# Patient Record
Sex: Female | Born: 1935 | Race: White
Health system: Southern US, Community
[De-identification: ages and names within clinical notes are randomized; demographics above are authoritative.]

## PROBLEM LIST (undated history)

## (undated) DIAGNOSIS — E079 Disorder of thyroid, unspecified: Secondary | ICD-10-CM

## (undated) DIAGNOSIS — M79602 Pain in left arm: Secondary | ICD-10-CM

## (undated) DIAGNOSIS — R42 Dizziness and giddiness: Secondary | ICD-10-CM

## (undated) DIAGNOSIS — E039 Hypothyroidism, unspecified: Secondary | ICD-10-CM

## (undated) DIAGNOSIS — Z950 Presence of cardiac pacemaker: Secondary | ICD-10-CM

## (undated) DIAGNOSIS — I499 Cardiac arrhythmia, unspecified: Secondary | ICD-10-CM

## (undated) DIAGNOSIS — K5900 Constipation, unspecified: Secondary | ICD-10-CM

## (undated) DIAGNOSIS — I472 Ventricular tachycardia, unspecified: Secondary | ICD-10-CM

## (undated) DIAGNOSIS — K219 Gastro-esophageal reflux disease without esophagitis: Secondary | ICD-10-CM

## (undated) DIAGNOSIS — N814 Uterovaginal prolapse, unspecified: Secondary | ICD-10-CM

## (undated) DIAGNOSIS — I4891 Unspecified atrial fibrillation: Secondary | ICD-10-CM

## (undated) DIAGNOSIS — I4719 Other supraventricular tachycardia: Secondary | ICD-10-CM

## (undated) DIAGNOSIS — E785 Hyperlipidemia, unspecified: Secondary | ICD-10-CM

## (undated) DIAGNOSIS — R079 Chest pain, unspecified: Secondary | ICD-10-CM

## (undated) DIAGNOSIS — R0602 Shortness of breath: Secondary | ICD-10-CM

## (undated) DIAGNOSIS — I471 Supraventricular tachycardia: Secondary | ICD-10-CM

## (undated) DIAGNOSIS — H9319 Tinnitus, unspecified ear: Secondary | ICD-10-CM

## (undated) HISTORY — DX: Unspecified atrial fibrillation: I48.91

## (undated) HISTORY — PX: ADENOIDECTOMY: SUR15

## (undated) HISTORY — DX: Supraventricular tachycardia: I47.1

## (undated) HISTORY — PX: TONSILLECTOMY: SUR1361

## (undated) HISTORY — DX: Other supraventricular tachycardia: I47.19

## (undated) HISTORY — DX: Shortness of breath: R06.02

## (undated) HISTORY — DX: Uterovaginal prolapse, unspecified: N81.4

## (undated) HISTORY — DX: Hyperlipidemia, unspecified: E78.5

## (undated) HISTORY — DX: Tinnitus, unspecified ear: H93.19

## (undated) HISTORY — DX: Pain in left arm: M79.602

## (undated) HISTORY — PX: MOHS SURGERY: SUR867

## (undated) HISTORY — DX: Dizziness and giddiness: R42

## (undated) HISTORY — DX: Chest pain, unspecified: R07.9

## (undated) HISTORY — DX: Ventricular tachycardia: I47.2

## (undated) HISTORY — DX: Hypothyroidism, unspecified: E03.9

## (undated) HISTORY — DX: Ventricular tachycardia, unspecified: I47.20

## (undated) HISTORY — PX: DENTAL SURGERY: SHX609

## (undated) SURGERY — PM GENERATOR CHANGE DUAL/BI-V
Anesthesia: Choice | Laterality: Bilateral

---

## 1995-11-16 ENCOUNTER — Ambulatory Visit
Admit: 1995-11-16 | Disposition: A | Discharge status: Home / Self Care | Payer: Self-pay | Source: Ambulatory Visit | Admitting: Plastic Surgery within the Head & Neck

## 2010-06-24 ENCOUNTER — Ambulatory Visit
Admission: RE | Admit: 2010-06-24 | Disposition: A | Discharge status: Home / Self Care | Payer: Self-pay | Source: Ambulatory Visit | Admitting: Cardiovascular Disease

## 2011-06-16 NOTE — Procedures (Signed)
[facility]      Name:         Margaret Shea, Margaret Shea                   Age          77      Study Date    06/24/2010                     Gender       Female      MRN           [id number]                        Race         Caucasian      EP Study No   [id number]                        Ht(in)       65.0      Account No.   [id number]                       Wt(lbs)      160.1      Date of Birth [birth year]                     BSA          1.80            CASE INFORMATION      Surgeon:                     Sandesara,Chirag,M.D.      Device Access Site:          Left Pectoral Region      Anesthesia Type:             IV Anesthesia      Estimated Blood Loss:        0-10 cc      Fluoro Time:                 3.5 min            Pre Operative Diagnoses      Sick Sinus Syndrome            Post Operative Diagnoses      Sick Sinus Syndrome            Procedures      PM Implant - dual                  History       77 y/o with severe sinus bradycardia and syncope, here for pacer implant,       dual.                  Procedural Medication           Dose     Unit      Route      Lactated Ringers                75       ml        IV      1% Lidocaine Hydrochloride      10       ml        Subcut  1% Lidocaine Hydrochloride      10       ml        Subcut      1% Lidocaine Hydrochloride      10       ml        Subcut            Implantable Device Specifications                  Pacemaker Lead(s)            Placement           RA endocardial      Status              Implanted      Manufacturer        St. Jude      Model No.           [id number]      Serial No.          [id number]      Date                06/24/2010      Length              46 cm            Placement           RV endocardial      Status              Implant      Manufacturer        St. Jude      Model No.           [id number]      Serial No.          [id number]      Date                06/24/2010      Length              52 cm                        Pacemaker  Pulse Generator            Type                Dual      Status              Implant      Manufacturer        St. Jude      Model No.           [id number]      Serial No.          [id number]      Date                06/24/2010                        Description of Technique      After informed consent was obtained, the patient was brought to the EP      laboratory in the fasting state and prepped and draped in the usual sterile      manner.  1.5 gm IV Cefuroxim was administered prophylactically  prior to the      procedure.  Patient was prepped for temporary wire.  1% lidocaine was      infiltrated to the left  pectoral region.  A subcutaneous pocket was created      using sharp and blunt dissection. The subclavian vein was cannulated x 2 using      the extra thoracic technique at the lateral subclavian vein over the first rib      and 2 retaining wires were placed. A subcutaneous pocket was created using      blunt dissection.   A 7 F peel away sheath was placed in the subclavian vein      over a retaining wire.  A lead was advanced to the RV Apex under fluoroscopic      guidance. Ventricular pacing and sensing thresholds were checked and were      acceptable.  The lead was sutured to the underlying pectoralis muscle using 0      silk suture.  Over the second retaining wire a 7 F peel away sheath was placed,      and a pacing lead was advanced to the right atrial appendage under fluoroscopic      guidance. Atrial pacing and sensing thresholds were checked and were      acceptable.  The lead was sutured to the underlying pectoralis muscle using 0      Ethibond suture.  The pocket was thoroughly irrigated with a saline solution      containing 50,000 units bacitracin.  Final sensing and pacing thresholds were      checked and were acceptable.  The leads were connected to the generator. With      the leads positioned behind the generator, the entire system was implanted into      the pocket. The subcutaneous tissue was  closed with 2.0 and 2.0 of continuous      Vicryl suture and the subcuticular layer was closed with continuous 4.0 Vicryl      suture.  Steri-Strips, antibiotic ointment, and 4 X 4's were secured with an      adhesive dressing      The patient tolerated the procedure well and left the laboratory in good      condition.      Throughout the procedure, general anesthesia was provided and appropriate      monitoring performed by members of the anesthesia staff.                        Plan      Routine post operative care. Discharge tomorrow after overnight observation.      Post-op antibiotics.      Routine follow-up in 1 week.      Keep wound clean and dry for one week.      Keep left arm in sling for 24 hours.      Limit left arm movement for 4 weeks.                                          Measurement Data through PSA      Lead  P/R WaveThresholdPulse      CurrentResistanceHigh Output Stim            (mV)     (V)       Width(ms)(mA)    (ohms)     Result      RA    2.0  1.4       0.5              508        No Diaphragmatic Stim      RV    12.1     1.3       0.5              920        No Diaphragmatic Stim                                    Device Settings      Mode:  DDD   Lower Rate (bpm): 50     Upper Rate (bpm):  130            Updated by  Sandesara,Chirag,M.D.  on  06/24/2010 6:47:59 PM                       Ferd Hibbs, M.D.    electronically signed on 06/24/2010 6:48:23 PM      with status of Final

## 2014-05-14 ENCOUNTER — Encounter (INDEPENDENT_AMBULATORY_CARE_PROVIDER_SITE_OTHER): Payer: Self-pay | Admitting: Cardiovascular Disease

## 2014-07-17 ENCOUNTER — Encounter (INDEPENDENT_AMBULATORY_CARE_PROVIDER_SITE_OTHER): Payer: Self-pay

## 2014-08-28 DIAGNOSIS — J189 Pneumonia, unspecified organism: Secondary | ICD-10-CM

## 2014-08-28 HISTORY — DX: Pneumonia, unspecified organism: J18.9

## 2014-09-21 ENCOUNTER — Encounter (INDEPENDENT_AMBULATORY_CARE_PROVIDER_SITE_OTHER): Payer: Medicare Other | Admitting: Cardiovascular Disease

## 2014-10-19 NOTE — Progress Notes (Signed)
This encounter was created in error - please disregard.

## 2014-10-20 ENCOUNTER — Ambulatory Visit (INDEPENDENT_AMBULATORY_CARE_PROVIDER_SITE_OTHER): Payer: Medicare Other | Admitting: Cardiovascular Disease

## 2014-10-20 ENCOUNTER — Encounter (INDEPENDENT_AMBULATORY_CARE_PROVIDER_SITE_OTHER): Payer: Self-pay | Admitting: Cardiovascular Disease

## 2014-10-20 VITALS — BP 126/68 | HR 60 | Ht 65.0 in | Wt 154.0 lb

## 2014-10-20 DIAGNOSIS — G4700 Insomnia, unspecified: Secondary | ICD-10-CM | POA: Insufficient documentation

## 2014-10-20 DIAGNOSIS — R001 Bradycardia, unspecified: Secondary | ICD-10-CM | POA: Insufficient documentation

## 2014-10-20 DIAGNOSIS — I480 Paroxysmal atrial fibrillation: Secondary | ICD-10-CM | POA: Insufficient documentation

## 2014-10-20 DIAGNOSIS — R42 Dizziness and giddiness: Secondary | ICD-10-CM | POA: Insufficient documentation

## 2014-10-20 DIAGNOSIS — E039 Hypothyroidism, unspecified: Secondary | ICD-10-CM | POA: Insufficient documentation

## 2014-10-20 DIAGNOSIS — R9431 Abnormal electrocardiogram [ECG] [EKG]: Secondary | ICD-10-CM | POA: Insufficient documentation

## 2014-10-20 DIAGNOSIS — I472 Ventricular tachycardia, unspecified: Secondary | ICD-10-CM | POA: Insufficient documentation

## 2014-10-20 DIAGNOSIS — I4719 Other supraventricular tachycardia: Secondary | ICD-10-CM | POA: Insufficient documentation

## 2014-10-20 DIAGNOSIS — Z950 Presence of cardiac pacemaker: Secondary | ICD-10-CM | POA: Insufficient documentation

## 2014-10-20 DIAGNOSIS — I4891 Unspecified atrial fibrillation: Secondary | ICD-10-CM | POA: Insufficient documentation

## 2014-10-20 DIAGNOSIS — R5383 Other fatigue: Secondary | ICD-10-CM

## 2014-10-20 DIAGNOSIS — I471 Supraventricular tachycardia: Secondary | ICD-10-CM | POA: Insufficient documentation

## 2014-10-20 DIAGNOSIS — E785 Hyperlipidemia, unspecified: Secondary | ICD-10-CM | POA: Insufficient documentation

## 2014-10-20 NOTE — Progress Notes (Signed)
[facility]    Chief Complaint   Patient presents with   . Atrial Fibrillation     pt is here fro 3 month follow up.   . Fatigue     pt is still c/o fatigue       Assessment and Plan       1. Paroxysmal atrial fibrillation   CHADS2-VasC score 2. CHADS2 score 1  08/03/13: TSH 0.2, FT4 2.41  08/09/13: K 4.5, Cr. 0.72, LFT normal, Hg 12.8  07/17/13 pacemaker check: 80% A paced, AT/AF burden 0%, < 1% since 09/24/2009.  08/20/13 Echo: nl LVSF, borderline LVH, nl LA size, no sig valvular abnormalities.  09/21/11 nuclear stress: no ischemia  Flecainide- Tinnitus. Pt's insurance does not cover Eliquis or Xarelto. Pt refused AC with clear understanding of the potential risk of stroke.    12/03/13: K 4.3, cr. 0.9, LFT normal, TSH 2.5, T4 9.6  In Atrial paced rhythm, stable, On ASA 325 mg po qd.   - Continue current meds.     2. Fatigue, unspecified type  12/03/13: K 4.3, cr. 0.9, LFT normal, TSH 2.5, T4 9.6, UA negative.  02/02/14 nuclear stress test: LVEF 72%, no ischemia  Consider multifactorial; improved after taking Melatonin.   - Encourage exercising.     3. Hypersomnia  frequently waking up during the night.   - Contributing to her fatigue. Pt has not schedule sleep study; sleeping quality improved after taking Melatonin.  - Encourage exercising and observe.     4. Pacemaker  07/17/13 pacemaker check: 80% A paced, AT/AF burden 0%, < 1% since 09/24/2009.  - functioning well; continue following in the [facility] clinic.     History of Present Illness   Pt states that she still gets up at least a couple of times at night going to the bathroom. She has been taking Melatonin which has helped with her sleeping at night. She still feels tired, but feels better when more physically active as the weather is getting better. She denies any CP, SOB, palpitations, dizziness or LH.    She visited ER in December , 2015 for flu and PNA, with chest pain ( viral pleurisy). The symptoms have resolved after Abx treatment.  Chest CTA at the time showed  small nodules in the lung. She is going for a Bx next week. Her thyroid function is now well controlled per pt.     Past Medical History     Past Medical History   Diagnosis Date   . Atrial fibrillation    . Ventricular tachycardia    . Hypothyroidism    . Atrial tachycardia    . Hyperlipidemia        Past Surgical History     Past Surgical History   Procedure Laterality Date   . Insert / replace / remove pacemaker     . Mouth surgery     . Back surgery  1980       Social History     History     Social History   . Marital Status: Married     Spouse Name: N/A     Number of Children: N/A   . Years of Education: N/A     Occupational History   . Not on file.     Social History Main Topics   . Smoking status: Never Smoker    . Smokeless tobacco: Not on file   . Alcohol Use: Yes      Comment: wine rare   .  Drug Use: No   . Sexual Activity: Not on file     Other Topics Concern   . Not on file     Social History Narrative   . No narrative on file       Family History     History reviewed. No pertinent family history.    Allergies     No Known Allergies    Medications     Current Outpatient Prescriptions   Medication Sig Dispense Refill   . aspirin 325 MG tablet Take 325 mg by mouth daily.     . calcium carbonate (CALTRATE) 600 MG tablet Take 600 mg by mouth 2 (two) times daily.     . Cranberry-Vitamin C-Probiotic (AZO CRANBERRY) 250-30 MG Tab Take 1 tablet by mouth 2 (two) times daily.     Marland Kitchen levothyroxine (SYNTHROID, LEVOTHROID) 50 MCG tablet Take 50 mcg by mouth Once a day at 6:00am.     . Multiple Vitamin (MULTIVITAMIN) capsule Take 1 capsule by mouth daily.     . simvastatin (ZOCOR) 20 MG tablet Take 20 mg by mouth nightly.       No current facility-administered medications for this visit.       Review of Systems     General:+ fatigue. Not Present- Fatigue, Weight Gain and Weight Loss.  Respiratory:Not Present- Bloody sputum, Cough, Dyspnea and Wheezing.  Cardiovascular:Not Present- Calf, thigh or buttock pain with  walking, Chest Pain, Difficulty Breathing Lying Down, Difficulty Breathing On Exertion, Edema, Awakening Short of Breath and Palpitations.  Gastrointestinal:Not Present- Bloody Stool, Constipation, Diarrhea, Hematemesis, Indigestion, Nausea and Vomiting.  Neurological:Not Present- Dizziness, Syncope and Weakness.    Physical Exam     Filed Vitals:    10/20/14 0957   BP: 126/68   Pulse: 60     General:  Patient appears their stated age, well-nourished.  Alert and in no apparent distress.  Eyes: No conjunctivitis, no purulent discharge, no lid lag  ENT:  Hearing grossly intact, Nares patent bilaterally, Lips moist, color appropriate for race.  Respiratory: Clear to auscultation and percussion throughout. Respiratory effort unlabored, chest expansion symmetric.    Cardio: Regular rate and rhythm. Normal S1/S2, no m/r/g.  No carotid bruits or thrills, no JVD.  Extremities: warm, pulses 2+, no peripheral edema  GI: Soft, nondistended, nontender.  No guarding or rebound.  Skin: Color appropriate for race, Skin warm, dry, and intact  Psychiatric: Good insight and judgment, oriented to person, place, and time    Labs   As above.     EKG   ECG 10/20/14: Atrial paced rhythm. No ST/T abnormalities.     Follow-Up     Return in about 6 months (around 04/20/2015).    Warmest Regards,    Kelli Hope, MD [facility]  [facility]

## 2015-03-25 ENCOUNTER — Encounter (INDEPENDENT_AMBULATORY_CARE_PROVIDER_SITE_OTHER): Payer: Self-pay | Admitting: Cardiovascular Disease

## 2015-03-25 ENCOUNTER — Ambulatory Visit (INDEPENDENT_AMBULATORY_CARE_PROVIDER_SITE_OTHER): Payer: Medicare Other | Admitting: Cardiovascular Disease

## 2015-03-25 VITALS — BP 132/70 | HR 76 | Ht 65.0 in | Wt 148.0 lb

## 2015-03-25 DIAGNOSIS — R5383 Other fatigue: Secondary | ICD-10-CM

## 2015-03-25 DIAGNOSIS — Z950 Presence of cardiac pacemaker: Secondary | ICD-10-CM

## 2015-03-25 DIAGNOSIS — G4700 Insomnia, unspecified: Secondary | ICD-10-CM

## 2015-03-25 DIAGNOSIS — I480 Paroxysmal atrial fibrillation: Secondary | ICD-10-CM

## 2015-03-25 NOTE — Progress Notes (Signed)
[facility]    Chief Complaint   Patient presents with   . Atrial Fibrillation     6 month FU, denies active cardiac symptoms in office.       Assessment and Plan       1. Paroxysmal atrial fibrillation   CHADS2-VasC score 2. CHADS2 score 1  08/03/13: TSH 0.2, FT4 2.41  08/09/13: K 4.5, Cr. 0.72, LFT normal, Hg 12.8  07/17/13 pacemaker check: 80% A paced, AT/AF burden 0%, < 1% since 09/24/2009.  08/20/13 Echo: nl LVSF, borderline LVH, nl LA size, no sig valvular abnormalities.  09/21/11 nuclear stress: no ischemia  Flecainide- Tinnitus. Pt's insurance does not cover Eliquis or Xarelto. Pt refused AC with clear understanding of the potential risk of stroke.    12/03/13: K 4.3, cr. 0.9, LFT normal, TSH 2.5, T4 9.6  Stable in atrial paced rhythm, stable, on ASA 325 mg po qd.   - Continue current meds.     2. Fatigue, unspecified type  12/03/13: K 4.3, cr. 0.9, LFT normal, TSH 2.5, T4 9.6, UA negative.  02/02/14 nuclear stress test: LVEF 72%, no ischemia  Consider multifactorial; improved after taking Melatonin and exercising   - Encourage continuing  exercising.     3. Hypersomnia/ insomnia  frequently waking up during the night,now improved after taking Melatonin.    - Continue Melatonin    4. Pacemaker  07/17/13 pacemaker check: 80% A paced, AT/AF burden 0%, < 1% since 09/24/2009.  - functioning well; continue following in the [facility] clinic.     I have spent 25 minutes with the pt, 50% time for counseling and education.     History of Present Illness   Pt states that she has been feeling well since the previous visit. No CP, SOB, palpitations, leg swelling , PND or orthopnea. She participates water aerobics three times a week, and fatigue has improved. She reports intermittent mild dizziness when standing up quickly.     Past Medical History     Past Medical History   Diagnosis Date   . Atrial fibrillation    . Ventricular tachycardia    . Hypothyroidism    . Atrial tachycardia    . Hyperlipidemia        Past Surgical  History     Past Surgical History   Procedure Laterality Date   . Insert / replace / remove pacemaker     . Mouth surgery     . Back surgery  1980       Social History     Social History     Social History   . Marital Status: Married     Spouse Name: N/A   . Number of Children: N/A   . Years of Education: N/A     Occupational History   . Not on file.     Social History Main Topics   . Smoking status: Never Smoker    . Smokeless tobacco: Not on file   . Alcohol Use: Yes      Comment: wine rare   . Drug Use: No   . Sexual Activity: Not on file     Other Topics Concern   . Not on file     Social History Narrative       Family History     History reviewed. No pertinent family history.    Allergies     No Known Allergies    Medications     Current Outpatient Prescriptions  Medication Sig Dispense Refill   . aspirin 325 MG tablet Take 325 mg by mouth daily.     Marland Kitchen atorvastatin (LIPITOR) 10 MG tablet Take 10 mg by mouth daily.     . calcium carbonate (CALTRATE) 600 MG tablet Take 600 mg by mouth 2 (two) times daily.     . Cranberry-Vitamin C-Probiotic (AZO CRANBERRY) 250-30 MG Tab Take 1 tablet by mouth 2 (two) times daily.     Tery Sanfilippo Sodium (COLACE PO) Take by mouth.     . levothyroxine (SYNTHROID, LEVOTHROID) 50 MCG tablet Take 50 mcg by mouth Once a day at 6:00am.     . Melatonin 5 MG Cap Take by mouth.     . Multiple Vitamin (MULTIVITAMIN) capsule Take 1 capsule by mouth daily.       No current facility-administered medications for this visit.       Review of Systems     General:Not Present- Fatigue, Weight Gain and Weight Loss.  Respiratory:Not Present- Bloody sputum, Cough, Dyspnea and Wheezing.  Cardiovascular:Not Present- Calf, thigh or buttock pain with walking, Chest Pain, Difficulty Breathing Lying Down, Difficulty Breathing On Exertion, Edema, Awakening Short of Breath and Palpitations.  Gastrointestinal:Not Present- Bloody Stool, Constipation, Diarrhea, Hematemesis, Indigestion, Nausea and  Vomiting.  Neurological:+ mild dizziness when standing up quickly, Not Present-Syncope and Weakness.    Physical Exam     Filed Vitals:    03/25/15 1135   BP: 132/70   Pulse: 76   Height: 1.651 m (5\' 5" )   Weight: 67.132 kg (148 lb)       General:  Patient appears their stated age, well-nourished.  Alert and in no apparent distress.  Eyes: No conjunctivitis, no purulent discharge, no lid lag  ENT:  Hearing grossly intact, Nares patent bilaterally, Lips moist, color appropriate for race.  Respiratory: Clear to auscultation and percussion throughout. Respiratory effort unlabored, chest expansion symmetric.    Cardio: Regular rate and rhythm. Normal S1/S2, no m/r/g.  No carotid bruits or thrills, no JVD.  Extremities: warm, pulses 2+ dp b/l, no peripheral edema  GI: Soft, nondistended, nontender.  No guarding or rebound.  Skin: Color appropriate for race, Skin warm, dry, and intact; mild varicose veins in the b/l lower legs.   Psychiatric: Good insight and judgment, oriented to person, place, and time    Labs   As above.     EKG   ECG 10/20/14: Atrial paced rhythm. No ST/T abnormalities.   ECG 03/25/15: Atrial paced rhythm, no sig ST/T abnormalities.     Follow-Up     Return in about 6 months (around 09/25/2015).    Warmest Regards,    Kelli Hope, MD [facility]  [facility]

## 2015-07-19 ENCOUNTER — Encounter (INDEPENDENT_AMBULATORY_CARE_PROVIDER_SITE_OTHER): Payer: Self-pay | Admitting: Cardiovascular Disease

## 2015-07-19 ENCOUNTER — Ambulatory Visit (INDEPENDENT_AMBULATORY_CARE_PROVIDER_SITE_OTHER): Payer: Medicare Other | Admitting: Cardiovascular Disease

## 2015-07-19 VITALS — BP 167/84 | HR 75 | Ht 65.0 in | Wt 146.0 lb

## 2015-07-19 DIAGNOSIS — I480 Paroxysmal atrial fibrillation: Secondary | ICD-10-CM

## 2015-07-19 DIAGNOSIS — R079 Chest pain, unspecified: Secondary | ICD-10-CM

## 2015-07-19 DIAGNOSIS — Z950 Presence of cardiac pacemaker: Secondary | ICD-10-CM

## 2015-07-19 NOTE — Progress Notes (Signed)
[facility]     Chief Complaint   Patient presents with   . Chest Pain     pt reports chest pain radiating to the jaw and arm   . Atrial Fibrillation     pt last seen in office by dr Dion Body 03/25/15     Assessment and Plan     1. Paroxysmal Atrial Fibrillation - CHADS Vasc Score is 2 - On ASA 325 mg     She has history of paroxysmal atrial fibrillation. She has remained in sinus rhythm and on her EKG in office today she is in normal sinus rhythm. Previous stress test was in 2013 and showed no ischemia.  I have asked her to undergo repeat stress myocardial perfusion study at this time.     - ECG 12 lead (Today)  - Exercise Nuclear Stress Test; Future    2. S/P St. Jude Dual Chamber Pacemaker - 06/24/2010    Follow Up with Device Clinic    3. Chest Pain    She complains of mild, intermittent chest pain that radiates to her jaw and left arm and shoulder.      - Exercise Nuclear Stress Test; Future    Previous Cardiovascular Testing     08/20/13 Echo: nl LVSF, borderline LVH, nl LA size, no sig valvular abnormalities.     09/21/11 nuclear stress: no ischemia      History of Present Illness     Margaret Shea is a 81 y.o. female is here to discuss Atrial Fibrillation. Patient has a history of Pacemaker Implant. Patient reports Chest Pain and Jaw Pain. The patient is mildly active with routine activities. Patient has the following Thromboembolic risk factors are > 44 years old (+2) and Female (+1). Based on these risk factors Her CHADS Vasc Score is 3. This patient is currently taking Statins and Aspirin.      Past Medical History     Past Medical History   Diagnosis Date   . Atrial fibrillation    . Ventricular tachycardia    . Hypothyroidism    . Atrial tachycardia    . Hyperlipidemia    . Prolapse of uterus    . Vertigo    . PNA (pneumonia)    . Left arm pain    . SOB (shortness of breath)    . Chest pain    . Pacemaker    . Arrhythmia      Bradycardia   . Malignant neoplasm of skin      Basal cell carcinoma - upper  lip - Mohs surgery, hairline and nose   . Abnormal vision      Wears reading glasses   . Constipation      Past Surgical History     Past Surgical History   Procedure Laterality Date   . Back surgery  1980   . Mohs surgery     . Insert / replace / remove pacemaker  October 2011   . Dental surgery       Root canal     Social History     Social History   Substance Use Topics   . Smoking status: Never Smoker    . Smokeless tobacco: None   . Alcohol Use: Yes      Comment: Glass of wine once in a while     Family History     History reviewed. No pertinent family history.  Allergies     No Known Allergies  Medications     Current Outpatient Prescriptions   Medication Sig Dispense Refill   . Docusate Sodium (COLACE PO) Take 200 mg by mouth every morning.        . Ibuprofen-Diphenhydramine Cit (ADVIL PM PO) Take by mouth as needed.     Marland Kitchen levothyroxine (SYNTHROID, LEVOTHROID) 50 MCG tablet Take 50 mcg by mouth Once a day at 6:00am.     . atorvastatin (LIPITOR) 40 MG tablet TAKE ONE TABLET BY MOUTH EVERY DAY  5   . nitroglycerin (NITROSTAT) 0.4 MG SL tablet Place 1 tablet (0.4 mg total) under the tongue as needed for Chest pain (q46min x3. call MD or 911 if pain persists). 25 tablet 3     No current facility-administered medications for this visit.     Review of Systems     General:Not Present- Fatigue, Weight Gain and Weight Loss.  Respiratory:Not Present- Bloody sputum, Cough, and Wheezing.  Cardiovascular:Not Present- Calf, thigh or buttock pain with walking, Chest Pain, Difficulty Breathing Lying Down, Dyspnea, Edema, Awakening Short of Breath, and Palpitations.  Gastrointestinal:Not Present- Bloody Stool, Constipation, Diarrhea, Hematemesis, Indigestion, Nausea and Vomiting.  Neurological:Not Present- Dizziness, Syncope and Weakness.    Physical Exam     Filed Vitals:    07/19/15 1101   BP: 167/84   Pulse: 75   Height: 1.651 m (5\' 5" )   Weight: 66.225 kg (146 lb)     Body mass index is 24.3 kg/(m^2).    General:   Patient appears their stated age, well-nourished.  Alert and in no apparent distress.  Eyes: No conjunctivitis, no purulent discharge, no lid lag  ENT:  Hearing grossly intact, Nares patent bilaterally, Lips moist, color appropriate for race.  Respiratory: Clear to auscultation throughout. Respiratory effort unlabored, chest expansion symmetric.    Cardio: Regular rate and rhythm. Normal S1/S2 No carotid bruits, no JVD.  Extremities: warm, pulses 2+, no peripheral edema  GI: Soft, nondistended, nontender.  No guarding or rebound.  Skin: Color appropriate for race, Skin warm, dry, and intact  Psychiatric: Good insight and judgment, oriented to person, place, and time    Labs     Lipid Panel No results found for: CHOL, TRIG, HDL  CBC No results found for: WBC, HGB, HCT, MCV, PLT   BMP: No results found for: NA, K, CL, CO2, BUN, CREAT, GLU, CA  INR No results found for: INR, PROTIME     EKG     I have personally reviewed and interpreted the resting electrocardiogram. The EKG revealed sinus rhythm.     Follow-Up     Return in about 6 months (around 01/16/2016).    Warmest Regards,  Hodari Chuba [place], [place]  [facility]

## 2015-07-29 ENCOUNTER — Encounter (INDEPENDENT_AMBULATORY_CARE_PROVIDER_SITE_OTHER): Payer: Self-pay

## 2015-08-06 ENCOUNTER — Other Ambulatory Visit (INDEPENDENT_AMBULATORY_CARE_PROVIDER_SITE_OTHER): Payer: Medicare Other

## 2015-08-07 ENCOUNTER — Encounter (INDEPENDENT_AMBULATORY_CARE_PROVIDER_SITE_OTHER): Payer: Self-pay

## 2015-08-11 ENCOUNTER — Ambulatory Visit (INDEPENDENT_AMBULATORY_CARE_PROVIDER_SITE_OTHER): Payer: Medicare Other

## 2015-08-11 ENCOUNTER — Encounter (INDEPENDENT_AMBULATORY_CARE_PROVIDER_SITE_OTHER): Payer: Self-pay

## 2015-08-11 DIAGNOSIS — R079 Chest pain, unspecified: Secondary | ICD-10-CM

## 2015-08-11 DIAGNOSIS — I480 Paroxysmal atrial fibrillation: Secondary | ICD-10-CM

## 2015-08-11 MED ORDER — TECHNETIUM TC 99M TETROFOSMIN INJECTION
1.0000 | Freq: Once | Status: AC | PRN
Start: 2015-08-11 — End: 2015-08-11
  Administered 2015-08-11: 1 via INTRAVENOUS

## 2015-08-11 MED ORDER — REGADENOSON 0.4 MG/5ML IV SOLN
0.4000 mg | Freq: Once | INTRAVENOUS | Status: AC | PRN
Start: 2015-08-11 — End: 2015-08-11
  Administered 2015-08-11: 0.4 mg via INTRAVENOUS

## 2015-08-12 ENCOUNTER — Ambulatory Visit (INDEPENDENT_AMBULATORY_CARE_PROVIDER_SITE_OTHER): Payer: Medicare Other | Admitting: Cardiovascular Disease

## 2015-08-12 ENCOUNTER — Telehealth (INDEPENDENT_AMBULATORY_CARE_PROVIDER_SITE_OTHER): Payer: Self-pay | Admitting: Cardiovascular Disease

## 2015-08-12 ENCOUNTER — Encounter (INDEPENDENT_AMBULATORY_CARE_PROVIDER_SITE_OTHER): Payer: Self-pay

## 2015-08-12 VITALS — BP 106/69 | HR 65 | Ht 65.0 in | Wt 143.0 lb

## 2015-08-12 DIAGNOSIS — R9439 Abnormal result of other cardiovascular function study: Secondary | ICD-10-CM

## 2015-08-12 DIAGNOSIS — R079 Chest pain, unspecified: Secondary | ICD-10-CM

## 2015-08-12 DIAGNOSIS — I480 Paroxysmal atrial fibrillation: Secondary | ICD-10-CM

## 2015-08-12 DIAGNOSIS — M79602 Pain in left arm: Secondary | ICD-10-CM

## 2015-08-12 MED ORDER — NITROGLYCERIN 0.4 MG SL SUBL
0.4000 mg | SUBLINGUAL_TABLET | SUBLINGUAL | Status: DC | PRN
Start: 2015-08-12 — End: 2022-12-19

## 2015-08-12 NOTE — Telephone Encounter (Signed)
Per Dr. Dion Body    Patient need Cath at [facility]    Does not have lab order but has handout    Home number is good.  [phone]    Please make fu with Dr. Dion Body in [place].    Thanks  [employer]

## 2015-08-12 NOTE — Progress Notes (Signed)
[facility]    Chief Complaint   Patient presents with   . Chest Pain     f/u apt for stress test 08/11/15, pt last seen in office by dr Charissa Bash 07/19/15   . Arm Pain     pt went to [facility] 08/07/15 for intermittent, dull left arm pain and left-sided chest pain, pt went to [facility] 07/29/15 for similar symptoms   . Shortness of Breath   . Atrial Fibrillation       Assessment and Plan   1. Chest pain:  Lexiscan nuclear stress test 08/11/15: a small sized area of  ischemia in the anterior wall, LVEF 75%.   Pt's symptoms and stress test result are concerning for underlying CAD  - LHC with possible PCI  - NTG sl PRN  - continue ASA 325 mg po qd.     2. Paroxysmal atrial fibrillation   CHADS2-VasC score 2. CHADS2 score 1  08/03/13: TSH 0.2, FT4 2.41  08/09/13: K 4.5, Cr. 0.72, LFT normal, Hg 12.8  07/17/13 pacemaker check: 80% A paced, AT/AF burden 0%, < 1% since 09/24/2009.  08/20/13 Echo: nl LVSF, borderline LVH, nl LA size, no sig valvular abnormalities.  09/21/11 nuclear stress: no ischemia  Flecainide- Tinnitus. Pt's insurance does not cover Eliquis or Xarelto. Pt refused AC with clear understanding of the potential risk of stroke.    12/03/13: K 4.3, cr. 0.9, LFT normal, TSH 2.5, T4 9.6  Stable in atrial paced rhythm, stable, on ASA 325 mg po qd.   - Continue current meds.     2. Fatigue, unspecified type  12/03/13: K 4.3, cr. 0.9, LFT normal, TSH 2.5, T4 9.6, UA negative.  02/02/14 nuclear stress test: LVEF 72%, no ischemia  Consider multifactorial; improved after taking Melatonin and exercising   - Encourage continuing  exercising.     3. Hypersomnia/ insomnia  frequently waking up during the night,now improved after taking Melatonin.    - Continue Melatonin    4. Pacemaker  07/17/13 pacemaker check: 80% A paced, AT/AF burden 0%, < 1% since 09/24/2009.  - functioning well; continue following in the [facility] clinic.     I have spent 25 minutes with the pt, 50% time for counseling and education.     History of Present  Illness   81 y/o female with a PMH of hypthyrodism, pAfib, s/p PPM , insomnia, prolapsed uterus, and recent ER visit for CP on 08/07/15.     Pt states that she has a few episodes of CP since 11/16, and visited ER at [facility] twice, with the last episode happened on 08/07/15. The CP was located in the left upper chest, with radiation to the left arm, back and/or left jaw. She has been feeling tired. A Lexiscan perfusion study was performed yesterday. She denies any SOB, palpitations, dizziness, LH, leg swelling , PND or orthopnea.     Past Medical History     Past Medical History   Diagnosis Date   . Atrial fibrillation    . Ventricular tachycardia    . Hypothyroidism    . Atrial tachycardia    . Hyperlipidemia    . Prolapse of uterus    . Vertigo    . PNA (pneumonia)    . Chest pain    . Left arm pain    . SOB (shortness of breath)        Past Surgical History     Past Surgical History   Procedure Laterality Date   .  Insert / replace / remove pacemaker     . Mouth surgery     . Back surgery  1980       Social History     Social History     Social History   . Marital Status: Married     Spouse Name: N/A   . Number of Children: N/A   . Years of Education: N/A     Occupational History   . Not on file.     Social History Main Topics   . Smoking status: Never Smoker    . Smokeless tobacco: Not on file   . Alcohol Use: Yes      Comment: wine rare   . Drug Use: No   . Sexual Activity: Not on file     Other Topics Concern   . Not on file     Social History Narrative       Family History     History reviewed. No pertinent family history.    Allergies     No Known Allergies    Medications     Current Outpatient Prescriptions   Medication Sig Dispense Refill   . aspirin 325 MG tablet Take 325 mg by mouth daily.     Marland Kitchen atorvastatin (LIPITOR) 40 MG tablet TAKE ONE TABLET BY MOUTH EVERY DAY  5   . Calcium Carb-Cholecalciferol (CALCIUM-VITAMIN D) 600-400 MG-UNIT Tab Take by mouth daily.     Marland Kitchen conjugated estrogens (PREMARIN)  vaginal cream Place vaginally daily.     . Cranberry-Vitamin C-Probiotic (AZO CRANBERRY) 250-30 MG Tab Take 2 tablets by mouth daily.        . diphenoxylate-atropine (LOMOTIL) 2.5-0.025 MG per tablet TAKE TWO TABLETS BY MOUTH THREE TIMES A DAY AS NEEDED FOR DIARRHEA FOR 2 DAYS  0   . Docusate Sodium (COLACE PO) Take 2 tablets by mouth 2 (two) times daily as needed.        . Ibuprofen-Diphenhydramine Cit (ADVIL PM PO) Take by mouth as needed.     Marland Kitchen levothyroxine (SYNTHROID, LEVOTHROID) 50 MCG tablet Take 50 mcg by mouth Once a day at 6:00am.     . nitroglycerin (NITROSTAT) 0.4 MG SL tablet Place 1 tablet (0.4 mg total) under the tongue as needed for Chest pain (q19min x3. call MD or 911 if pain persists). 25 tablet 3     No current facility-administered medications for this visit.       Review of Systems     General:+ fatigue and weakness, Not Present-  Weight Gain and Weight Loss.  Respiratory:Not Present- Bloody sputum, Cough, Dyspnea and Wheezing.  Cardiovascular:+ CP, DOE. Not Present- Calf, thigh or buttock pain with walking, Difficulty Breathing Lying Down, Edema, Awakening Short of Breath and Palpitations.  Gastrointestinal:Not Present- Bloody Stool, Constipation, Diarrhea, Hematemesis, Indigestion, Nausea and Vomiting.  Neurological:+ mild dizziness when standing up quickly, Not Present-Syncope and Weakness.    Physical Exam     Filed Vitals:    08/12/15 1505   BP: 106/69   Pulse: 65   Height: 1.651 m (5\' 5" )   Weight: 64.864 kg (143 lb)       General:  Patient appears their stated age, well-nourished.  Alert and in no apparent distress.  Eyes: No conjunctivitis, no purulent discharge, no lid lag  ENT:  Hearing grossly intact, Nares patent bilaterally, Lips moist, color appropriate for race.  Respiratory: Clear to auscultation and percussion throughout. Respiratory effort unlabored, chest expansion symmetric.    Cardio: Regular rate  and rhythm. Normal S1/S2, no m/r/g.  No carotid bruits or thrills, no  JVD.  Extremities: warm, pulses 2+ dp b/l, no peripheral edema  GI: Soft, nondistended, nontender.  No guarding or rebound.  Skin: Color appropriate for race, Skin warm, dry, and intact; mild varicose veins in the b/l lower legs.   Psychiatric: Good insight and judgment, oriented to person, place, and time    Labs   As above.     EKG   ECG 10/20/14: Atrial paced rhythm. No ST/T abnormalities.   ECG 03/25/15: Atrial paced rhythm, no sig ST/T abnormalities.   ECG 08/12/15: Atrial paced rhythm, no sig ST/T abnormalities.     Follow-Up     Return in about 3 weeks (around 09/02/2015).    Warmest Regards,    Kelli Hope, MD [facility]  [facility]

## 2015-08-13 ENCOUNTER — Encounter: Payer: Self-pay | Admitting: Cardiovascular Disease

## 2015-08-13 ENCOUNTER — Ambulatory Visit: Payer: Medicare Other

## 2015-08-13 NOTE — Pre-Procedure Instructions (Signed)
   Labs done 12/10 at [facility]    Arrival/Procedure times reviewed:  1100/1300 read back   Pt verbalized understanding of NPO instructions:  NPO after MN with sips of water to take am medications - read back   PM

## 2015-08-13 NOTE — Telephone Encounter (Signed)
Patient is scheduled with Dr. Richrd Sox 08/16/15.

## 2015-08-16 ENCOUNTER — Encounter (INDEPENDENT_AMBULATORY_CARE_PROVIDER_SITE_OTHER): Payer: Self-pay

## 2015-08-16 ENCOUNTER — Ambulatory Visit: Admit: 2015-08-16 | Payer: Medicare Other | Admitting: Cardiovascular Disease

## 2015-08-16 DIAGNOSIS — R072 Precordial pain: Secondary | ICD-10-CM

## 2015-08-16 HISTORY — DX: Presence of cardiac pacemaker: Z95.0

## 2015-08-16 HISTORY — DX: Constipation, unspecified: K59.00

## 2015-08-16 HISTORY — DX: Cardiac arrhythmia, unspecified: I49.9

## 2015-08-16 SURGERY — LEFT HEART CATH POSS PCI
Anesthesia: Conscious Sedation | Laterality: Left

## 2015-09-02 ENCOUNTER — Encounter (INDEPENDENT_AMBULATORY_CARE_PROVIDER_SITE_OTHER): Payer: Self-pay | Admitting: Cardiovascular Disease

## 2015-09-02 ENCOUNTER — Ambulatory Visit (INDEPENDENT_AMBULATORY_CARE_PROVIDER_SITE_OTHER): Payer: Medicare Other | Admitting: Cardiovascular Disease

## 2015-09-02 VITALS — BP 126/76 | HR 65 | Ht 65.0 in | Wt 143.0 lb

## 2015-09-02 DIAGNOSIS — Z9889 Other specified postprocedural states: Secondary | ICD-10-CM

## 2015-09-02 DIAGNOSIS — R072 Precordial pain: Secondary | ICD-10-CM

## 2015-09-02 DIAGNOSIS — I480 Paroxysmal atrial fibrillation: Secondary | ICD-10-CM

## 2015-09-02 MED ORDER — ASPIRIN 325 MG PO TBEC
325.0000 mg | DELAYED_RELEASE_TABLET | Freq: Every day | ORAL | Status: DC
Start: 2015-09-02 — End: 2016-08-31

## 2015-09-02 NOTE — Progress Notes (Signed)
[facility]    Chief Complaint   Patient presents with   . post cardiac cath     Cardiac Cath at [facility] 08/16/15   . Atrial Fibrillation     pt last seen in office 08/12/15   . Dizziness     pt reports occasional dizziness, ORTHOSTATIC BP negative today       Assessment and Plan   1. Chest pain  Consider due to GERD/esophageal spasm; +/- coronary artery spasm/ myocardial bridge.   Lexiscan nuclear stress test 08/11/15: a small sized area of  ischemia in the anterior wall, LVEF 75%.   Admission on 08/16/15 in [facility], s/p LHC, started on Protonix and Imdur.   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   - Continue Protonix, Maalox PRN and NTG sl PRN.   - Follow GERD/ esophageal spasm with Dr. Ramiro Harvest.   - Encourage exercising.     2. Paroxysmal atrial fibrillation   CHADS2-VasC score 2. CHADS2 score 1  07/17/13 pacemaker check: 80% A paced, AT/AF burden 0%, < 1% since 09/24/2009.  09/21/11 nuclear stress: no ischemia  Flecainide- Tinnitus. Pt's insurance does not cover Eliquis or Xarelto. Pt refused AC with clear understanding of the potential risk of stroke.    Stable in atrial paced rhythm,  on ECASA 325 mg po qd.   - Continue current meds.     3. Fatigue, unspecified type  12/03/13: K 4.3, cr. 0.9, LFT normal, TSH 2.5, T4 9.6, UA negative.  02/02/14 nuclear stress test: LVEF 72%, no ischemia  Consider multifactorial; improved after taking Melatonin and exercising   - Encourage continuing  exercising.     4. Hypersomnia/ insomnia  frequently waking up during the night,now improved after taking Melatonin.    - Continue Melatonin    5. Pacemaker  07/17/13 pacemaker check: 80% A paced, AT/AF burden 0%, < 1% since 09/24/2009.  - functioning well; continue following in the [facility] clinic.     I have spent 25 minutes with the pt, 50% time for counseling and education.     History of Present  Illness   82 y/o female with a PMH of hypthyrodism, pAfib, s/p PPM , insomnia, prolapsed uterus, and recent ER visit for CP on 08/07/15.     Pt developed recurrent CP (3-4/10) with Left arm and jaw pain on 08/16/15 at night that woke her up from sleep , called EMS and was sent to [facility]. She under went LHC which showed non-obstructive CAD, but with LAD myocardial bridging.  Echo showed nl LVSF , with LVEF at 55-60% without RWMA. She took NTG sl x3 with some relief of the CP, She was started on Protonix for empirical treatment of GERD/esophageal spasm, and Imdur for empirical treatment of coronary artery spasm or bridge. She reported feeling very tired after taking Imdur, thus it was discontinued on 08/21/15.  She is currently still feels a little bit of tired, but feels much better;  no recurrent CP, palpitations, or SOB.     Past Medical History     Past Medical History   Diagnosis Date   . Atrial fibrillation    . Ventricular tachycardia    . Hypothyroidism    . Atrial tachycardia    . Hyperlipidemia    . Prolapse of uterus    . Vertigo    . PNA (pneumonia)    .  Left arm pain    . SOB (shortness of breath)    . Chest pain    . Pacemaker    . Arrhythmia      Bradycardia   . Malignant neoplasm of skin      Basal cell carcinoma - upper lip - Mohs surgery, hairline and nose   . Abnormal vision      Wears reading glasses   . Constipation        Past Surgical History     Past Surgical History   Procedure Laterality Date   . Back surgery  1980   . Mohs surgery     . Insert / replace / remove pacemaker  October 2011   . Dental surgery       Root canal   . Cardiac catheterization  08/16/2015       Social History     Social History     Social History   . Marital Status: Married     Spouse Name: N/A   . Number of Children: N/A   . Years of Education: N/A     Occupational History   . Not on file.     Social History Main Topics   . Smoking status: Never Smoker    . Smokeless tobacco: Not on file   . Alcohol Use: Yes      Comment:  Glass of wine occasionally   . Drug Use: No   . Sexual Activity: Not on file     Other Topics Concern   . Not on file     Social History Narrative       Family History     History reviewed. No pertinent family history.    Allergies     No Known Allergies    Medications     Current Outpatient Prescriptions   Medication Sig Dispense Refill   . atorvastatin (LIPITOR) 40 MG tablet TAKE ONE TABLET BY MOUTH EVERY DAY  5   . calcium carbonate (CALTRATE) 600 MG tablet Take 600 mg by mouth.     . Cranberry-Vitamin C-Probiotic (AZO CRANBERRY) 250-30 MG Tab Take 1 tablet by mouth.     Tery Sanfilippo Sodium (COLACE PO) Take 200 mg by mouth every morning.        . Estradiol (YUVAFEM) 10 MCG Tab Place vaginally. INSERT ONE TABLET VAGINALLY THREE TIMES A WEEK FOR 90 DAYS     . Ibuprofen-Diphenhydramine Cit (ADVIL PM PO) Take by mouth as needed.     Marland Kitchen levothyroxine (SYNTHROID, LEVOTHROID) 50 MCG tablet Take 50 mcg by mouth Once a day at 6:00am.     . nitroglycerin (NITROSTAT) 0.4 MG SL tablet Place 1 tablet (0.4 mg total) under the tongue as needed for Chest pain (q19min x3. call MD or 911 if pain persists). 25 tablet 3   . pantoprazole (PROTONIX) 40 MG tablet TAKE ONE TABLET BY MOUTH EVERY DAY  0   . RaNITidine HCl (ZANTAC PO) Take by mouth 2 (two) times daily.     Marland Kitchen aspirin EC 325 MG EC tablet Take 1 tablet (325 mg total) by mouth daily. 90 tablet 3   . isosorbide mononitrate (IMDUR) 30 MG 24 hr tablet Take 30 mg by mouth daily.          No current facility-administered medications for this visit.       Review of Systems     General:+ fatigue. Not Present-  Weight Gain and Weight Loss.  Respiratory:Not Present- Bloody sputum,  Cough, Dyspnea and Wheezing.  Cardiovascular:+ CP, DOE. Not Present- Calf, thigh or buttock pain with walking, Difficulty Breathing Lying Down, Edema, Awakening Short of Breath and Palpitations.  Gastrointestinal:Not Present- Bloody Stool, Constipation, Diarrhea, Hematemesis, Indigestion, Nausea and  Vomiting.  Neurological:+ mild dizziness when standing up quickly, Not Present-Syncope and Weakness.    Physical Exam     Filed Vitals:    09/02/15 1313 09/02/15 1314   BP: 111/61 126/76   Pulse: 60 65   Height: 1.651 m (5\' 5" )    Weight: 64.864 kg (143 lb)        General:  Patient appears their stated age, well-nourished.  Alert and in no apparent distress.  Eyes: No conjunctivitis, no purulent discharge, no lid lag  ENT:  Hearing grossly intact, Nares patent bilaterally, Lips moist, color appropriate for race.  Respiratory: Clear to auscultation and percussion throughout. Respiratory effort unlabored, chest expansion symmetric.    Cardio: Regular rate and rhythm. Normal S1/S2, no m/r/g.  No carotid bruits or thrills, no JVD.  Extremities: warm, pulses 2+ dp b/l, no peripheral edema; R wrist cath site is clean, no swelling, b/l radial pulses equal and normal.   GI: Soft, nondistended, nontender.  No guarding or rebound.  Skin: Color appropriate for race, Skin warm, dry, and intact; mild varicose veins in the b/l lower legs.   Psychiatric: Good insight and judgment, oriented to person, place, and time    Labs   08/03/13: TSH 0.2, FT4 2.41  08/09/13: K 4.5, Cr. 0.72, LFT normal, Hg 12.8  12/03/13: K 4.3, cr. 0.9, LFT normal, TSH 2.5, T4 9.6  08/16/15: Hg 12.1, plt 212, wbc 8.0, BUN 17, Cr. 0.63, Na 139, K 4.2, glucose 208, LFT normal,   04/16/2013: T. Chol 179, Trig 210, HDL 46, LDL 91 mg/dl    EKG   ECG 1/61/09: Atrial paced rhythm. No ST/T abnormalities.   ECG 03/25/15: Atrial paced rhythm, no sig ST/T abnormalities.   ECG 08/12/15: Atrial paced rhythm, no sig ST/T abnormalities.   ECG 09/02/15: Atrial paced rhythm, no sig ST/T abnormalities.     Diagnostic studies:  08/20/13 Echo: nl LVSF, borderline LVH, nl LA size, no sig valvular abnormalities.    Follow-Up     Return in about 2 months (around 10/31/2015).    Warmest Regards,    Kelli Hope, MD [facility]  [facility]

## 2015-09-15 ENCOUNTER — Other Ambulatory Visit (INDEPENDENT_AMBULATORY_CARE_PROVIDER_SITE_OTHER): Payer: Self-pay | Admitting: Cardiovascular Disease

## 2015-09-15 DIAGNOSIS — I2510 Atherosclerotic heart disease of native coronary artery without angina pectoris: Secondary | ICD-10-CM

## 2015-09-15 MED ORDER — PANTOPRAZOLE SODIUM 40 MG PO TBEC
40.0000 mg | DELAYED_RELEASE_TABLET | Freq: Every day | ORAL | Status: DC
Start: 2015-09-15 — End: 2015-12-03

## 2015-09-15 NOTE — Telephone Encounter (Signed)
Pt needs a refill for Pantoprazole, she uses [facility] in [place]. Thank you.

## 2015-09-15 NOTE — Telephone Encounter (Signed)
Last seen 09/12/15.  Will f/u 10/2015

## 2015-11-11 ENCOUNTER — Ambulatory Visit (INDEPENDENT_AMBULATORY_CARE_PROVIDER_SITE_OTHER): Payer: Medicare Other | Admitting: Cardiovascular Disease

## 2015-11-11 ENCOUNTER — Encounter (INDEPENDENT_AMBULATORY_CARE_PROVIDER_SITE_OTHER): Payer: Self-pay | Admitting: Cardiovascular Disease

## 2015-11-11 VITALS — BP 113/53 | HR 75 | Ht 64.0 in | Wt 143.0 lb

## 2015-11-11 DIAGNOSIS — R072 Precordial pain: Secondary | ICD-10-CM

## 2015-11-11 DIAGNOSIS — I480 Paroxysmal atrial fibrillation: Secondary | ICD-10-CM

## 2015-11-11 DIAGNOSIS — Z950 Presence of cardiac pacemaker: Secondary | ICD-10-CM

## 2015-11-11 NOTE — Progress Notes (Signed)
[facility]    Chief Complaint   Patient presents with   . Atrial Fibrillation     2 month fu. Pt. c/o dizziness. denies other cardiac symptoms.       Assessment and Plan   1. Chest pain  Consider due to GERD/esophageal spasm; +/- coronary artery spasm/ myocardial bridge.   Admission on 08/16/15 in [facility], s/p LHC, started on Protonix and Imdur.   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   - Continue Protonix, Maalox PRN and NTG sl PRN.   - Follow GERD/ esophageal spasm with Dr. Ramiro Harvest.   - Encourage exercising.     2. Paroxysmal atrial fibrillation   CHADS2-VasC score 2. CHADS2 score 1  Flecainide- Tinnitus. Pt's insurance does not cover Eliquis or Xarelto. Pt refused AC with clear understanding of the potential risk of stroke.    Stable in atrial paced rhythm, asymptomatic, on ECASA 325 mg po qd.   - Continue current meds.     3. Pacemaker  07/17/13 pacemaker check: 80% A paced, AT/AF burden 0%, < 1% since 09/24/2009.  - Will schedule a recheck  - Appears working well.     4. Fatigue, unspecified type  12/03/13: K 4.3, cr. 0.9, LFT normal, TSH 2.5, T4 9.6, UA negative.  02/02/14 nuclear stress test: LVEF 72%, no ischemia  Consider multifactorial; improved after taking Melatonin and exercising   - Encourage continuing  exercising.     5. Hypersomnia/ insomnia  frequently waking up during the night,now improved after taking Melatonin.    - Continue Melatonin    I have spent 25 minutes with the pt, 50% time for counseling and education.       Diagnostic Studies   08/20/13 Echo: nl LVSF, borderline LVH, nl LA size, no sig valvular abnormalities.  07/17/13 pacemaker check: 80% A paced, AT/AF burden 0%, < 1% since 09/24/2009.  09/21/11 nuclear stress: no ischemia  Lexiscan nuclear stress test 08/11/15: a small sized area of  ischemia in the anterior wall, LVEF 75%.     History of Present  Illness   82 y/o female with a PMH of hypthyrodism, pAfib, s/p PPM , insomnia, prolapsed uterus, and recent ER visit for CP on 08/07/15.     Pt reports currently having UTI, with dizziness and fatigue. She is taking Bactrim. No recurrent chest pain. She is still taking Protonix, wondering if needs to continue taking the med. She denies any palpitations.     Past Medical History     Past Medical History   Diagnosis Date   . Atrial fibrillation    . Ventricular tachycardia    . Hypothyroidism    . Atrial tachycardia    . Hyperlipidemia    . Prolapse of uterus    . Vertigo    . PNA (pneumonia)    . Left arm pain    . SOB (shortness of breath)    . Chest pain    . Pacemaker    . Arrhythmia      Bradycardia   . Malignant neoplasm of skin      Basal cell carcinoma - upper lip - Mohs surgery, hairline and nose   . Abnormal vision      Wears reading glasses   . Constipation        Past Surgical History     Past Surgical History   Procedure  Laterality Date   . Back surgery  1980   . Mohs surgery     . Insert / replace / remove pacemaker  October 2011   . Dental surgery       Root canal   . Cardiac catheterization  08/16/2015       Social History     Social History     Social History   . Marital Status: Married     Spouse Name: N/A   . Number of Children: N/A   . Years of Education: N/A     Occupational History   . Not on file.     Social History Main Topics   . Smoking status: Never Smoker    . Smokeless tobacco: Not on file   . Alcohol Use: Yes      Comment: Glass of wine occasionally   . Drug Use: No   . Sexual Activity: Not on file     Other Topics Concern   . Not on file     Social History Narrative       Family History     History reviewed. No pertinent family history.    Allergies     No Known Allergies    Medications     Current Outpatient Prescriptions   Medication Sig Dispense Refill   . aspirin EC 325 MG EC tablet Take 1 tablet (325 mg total) by mouth daily. 90 tablet 3   . atorvastatin (LIPITOR) 40 MG tablet TAKE  ONE TABLET BY MOUTH EVERY DAY  5   . calcium carbonate (CALTRATE) 600 MG tablet Take 600 mg by mouth.     . Calcium-Magnesium-Vitamin D (CALCIUM 500 PO) Take by mouth.     . CRANBERRY PO Take by mouth.     . Estradiol (YUVAFEM) 10 MCG Tab Place vaginally.     . Ibuprofen-Diphenhydramine Cit (ADVIL PM PO) Take by mouth as needed.     Marland Kitchen levothyroxine (SYNTHROID, LEVOTHROID) 50 MCG tablet Take 50 mcg by mouth Once a day at 6:00am.     . nitroglycerin (NITROSTAT) 0.4 MG SL tablet Place 1 tablet (0.4 mg total) under the tongue as needed for Chest pain (q29min x3. call MD or 911 if pain persists). 25 tablet 3   . pantoprazole (PROTONIX) 40 MG tablet Take 1 tablet (40 mg total) by mouth daily. 90 tablet 1   . sulfamethoxazole-trimethoprim (BACTRIM DS,SEPTRA DS) 800-160 MG per tablet TAKE ONE TABLET BY MOUTH TWICE A DAY FOR 5 DAYS  0     No current facility-administered medications for this visit.       Review of Systems     General:+ fatigue. Not Present-  Weight Gain and Weight Loss.  Respiratory:Not Present- Bloody sputum, Cough, Dyspnea and Wheezing.  Cardiovascular:Not Present- Calf, thigh or buttock pain with walking, Difficulty Breathing Lying Down, Edema, Awakening Short of Breath and Palpitations.  Gastrointestinal:Not Present- Bloody Stool, Constipation, Diarrhea, Hematemesis, Indigestion, Nausea and Vomiting.  Neurological:+ mild dizziness when standing up quickly, Not Present-Syncope and Weakness.    Physical Exam     Filed Vitals:    11/11/15 1014   BP: 113/53   Pulse: 75   Height: 1.626 m (5\' 4" )   Weight: 64.864 kg (143 lb)       General:  Patient appears their stated age, well-nourished.  Alert and in no apparent distress.  Eyes: No conjunctivitis, no purulent discharge, no lid lag  ENT:  Hearing grossly intact, Nares patent bilaterally, Lips moist,  color appropriate for race.  Respiratory: Clear to auscultation and percussion throughout. Respiratory effort unlabored, chest expansion symmetric.     Cardio: Regular rate and rhythm. Normal S1/S2, no m/r/g.  No carotid bruits or thrills, no JVD.  Extremities: warm, pulses 2+ dp b/l, no peripheral edema; R wrist cath site is clean, no swelling, b/l radial pulses equal and normal.   GI: Soft, nondistended, nontender.  No guarding or rebound.  Skin: Color appropriate for race, Skin warm, dry, and intact; mild varicose veins in the b/l lower legs.   Psychiatric: Good insight and judgment, oriented to person, place, and time    Labs   08/03/13: TSH 0.2, FT4 2.41  08/09/13: K 4.5, Cr. 0.72, LFT normal, Hg 12.8  12/03/13: K 4.3, cr. 0.9, LFT normal, TSH 2.5, T4 9.6  08/16/15: Hg 12.1, plt 212, wbc 8.0, BUN 17, Cr. 0.63, Na 139, K 4.2, glucose 208, LFT normal,   04/16/2013: T. Chol 179, Trig 210, HDL 46, LDL 91 mg/dl    EKG   ECG 1/61/09: Atrial paced rhythm. No ST/T abnormalities.   ECG 03/25/15: Atrial paced rhythm, no sig ST/T abnormalities.   ECG 08/12/15: Atrial paced rhythm, no sig ST/T abnormalities.   ECG 09/02/15: Atrial paced rhythm, no sig ST/T abnormalities.  ECG 11/11/15: Atrial paced rhythm, nonspecific T wave abnormalities.   Follow-Up     Return in about 3 months (around 02/11/2016).    Warmest Regards,    Kelli Hope, MD [facility]  [facility]

## 2015-11-16 ENCOUNTER — Encounter (INDEPENDENT_AMBULATORY_CARE_PROVIDER_SITE_OTHER): Payer: Medicare Other

## 2015-12-03 ENCOUNTER — Telehealth (INDEPENDENT_AMBULATORY_CARE_PROVIDER_SITE_OTHER): Payer: Self-pay

## 2015-12-03 ENCOUNTER — Encounter (INDEPENDENT_AMBULATORY_CARE_PROVIDER_SITE_OTHER): Payer: Self-pay | Admitting: Cardiovascular Disease

## 2015-12-03 ENCOUNTER — Ambulatory Visit (INDEPENDENT_AMBULATORY_CARE_PROVIDER_SITE_OTHER): Payer: Medicare Other | Admitting: Cardiovascular Disease

## 2015-12-03 VITALS — BP 122/73 | HR 80 | Ht 64.0 in | Wt 143.0 lb

## 2015-12-03 DIAGNOSIS — K219 Gastro-esophageal reflux disease without esophagitis: Secondary | ICD-10-CM

## 2015-12-03 DIAGNOSIS — R072 Precordial pain: Secondary | ICD-10-CM

## 2015-12-03 DIAGNOSIS — R42 Dizziness and giddiness: Secondary | ICD-10-CM

## 2015-12-03 DIAGNOSIS — I480 Paroxysmal atrial fibrillation: Secondary | ICD-10-CM

## 2015-12-03 MED ORDER — FAMOTIDINE 20 MG PO TABS
20.0000 mg | ORAL_TABLET | Freq: Two times a day (BID) | ORAL | Status: DC
Start: 2015-12-03 — End: 2016-04-10

## 2015-12-03 NOTE — Telephone Encounter (Signed)
Patient was transferred to me from [place]    Patient states that she has been experiencing severe dizziness and fatigue and feels as though she is going to pass out for the past few weeks and symptoms are worsening.   She states she is extremely concerned that if she waits to be seen through the weekend something serious will happen.   She is unable to obtain vitals at this time however, she states she believes that her BP is running exteremly low.     Patient is requesting to be seen by [facility]Z today in [place].  I advised patient that [facility] had no openings however she requested that she be worked in stating that QZ advised her at last office visit if symptoms worsened she was to call office to be seen immediately.    Patient was transferred to call center to [facility] to schedule work in appointment for today with QZ in [place].

## 2015-12-03 NOTE — Progress Notes (Signed)
[facility]    Chief Complaint   Patient presents with   . Atrial Fibrillation     FU. Pt. c/o dizziness and fatigue.    . Dizziness       Assessment and Plan   1. Dizziness  Consider noncardiac  Pt reports symptoms starting after starting on Protonix  Not orthostatic today in clinic  - Hold Protoix  -Switch to Pepcid 20 mg po bid, Maalox PRN  - Carotid artery duplex  - If no improvement, will pursue Neurology consult.     2. Chest pain  Consider due to GERD/esophageal spasm; +/- coronary artery spasm/ myocardial bridge.   Admission on 08/16/15 in [facility], s/p LHC, started on Protonix and Imdur.   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   Now reports dizziness after taking Protonix  - Switch to Pepcid, Maalox PRN and NTG sl PRN.   - Follow GERD/ esophageal spasm with Dr. Ramiro Harvest.   - Encourage exercising.     3. Paroxysmal atrial fibrillation  CHADS2-VasC score 2. CHADS2 score 1  Flecainide- Tinnitus. Pt's insurance does not cover Eliquis or Xarelto. Pt refused AC with clear understanding of the potential risk of stroke.    Stable in atrial paced rhythm, asymptomatic, on ECASA 325 mg po qd.   - Continue current meds.     4. Pacemaker  07/17/13 pacemaker check: 80% A paced, AT/AF burden 0%, < 1% since 09/24/2009.  Working well  - pacemaker check scheduled next week.     5. Fatigue, unspecified type  02/02/14 nuclear stress test: LVEF 72%, no ischemia  Consider multifactorial; improved after taking Melatonin and exercising   - Encourage continuing  exercising.     6. Hypersomnia/ insomnia  frequently waking up during the night,now improved after taking Melatonin.    - Continue Melatonin    I have spent 25 minutes with the pt, 50% time for counseling and education.       Diagnostic Studies   08/20/13 Echo: nl LVSF, borderline LVH, nl LA size, no sig valvular abnormalities.  07/17/13  pacemaker check: 80% A paced, AT/AF burden 0%, < 1% since 09/24/2009.  09/21/11 nuclear stress: no ischemia  Lexiscan nuclear stress test 08/11/15: a small sized area of  ischemia in the anterior wall, LVEF 75%.     History of Present Illness   82 y/o female with a PMH of hypthyrodism, pAfib, s/p PPM , insomnia, prolapsed uterus, and recent ER visit for CP on 08/07/15.     Pt states that her UTI has resolved, however, she continues to feel dizzy, mostly when standing up walking, but also happens at rest and when walking, feeling like losing balance. No CP, SOB, or palpitations. Today, she reports that the dizziness might started after taking Pantoprazole in 12/16. The med did help with the heart burn but she still has some symptoms daily  that usually could be taken care of by taking TUMs.      Past Medical History     Past Medical History   Diagnosis Date   . Atrial fibrillation    . Ventricular tachycardia    . Hypothyroidism    . Atrial tachycardia    . Hyperlipidemia    . Prolapse of uterus    . Vertigo    . PNA (pneumonia)    . Left arm pain    . SOB (  shortness of breath)    . Chest pain    . Pacemaker    . Arrhythmia      Bradycardia   . Malignant neoplasm of skin      Basal cell carcinoma - upper lip - Mohs surgery, hairline and nose   . Abnormal vision      Wears reading glasses   . Constipation        Past Surgical History     Past Surgical History   Procedure Laterality Date   . Back surgery  1980   . Mohs surgery     . Insert / replace / remove pacemaker  October 2011   . Dental surgery       Root canal   . Cardiac catheterization  08/16/2015       Social History     Social History     Social History   . Marital Status: Married     Spouse Name: N/A   . Number of Children: N/A   . Years of Education: N/A     Occupational History   . Not on file.     Social History Main Topics   . Smoking status: Never Smoker    . Smokeless tobacco: Not on file   . Alcohol Use: Yes      Comment: Glass of wine occasionally   .  Drug Use: No   . Sexual Activity: Not on file     Other Topics Concern   . Not on file     Social History Narrative       Family History     History reviewed. No pertinent family history.    Allergies     No Known Allergies    Medications     Current Outpatient Prescriptions   Medication Sig Dispense Refill   . aspirin EC 325 MG EC tablet Take 1 tablet (325 mg total) by mouth daily. 90 tablet 3   . atorvastatin (LIPITOR) 40 MG tablet 60 MG DAILY  5   . calcium carbonate (CALTRATE) 600 MG tablet Take 600 mg by mouth.     . Calcium-Magnesium-Vitamin D (CALCIUM 500 PO) Take by mouth.     . CRANBERRY PO Take by mouth.     . Estradiol (YUVAFEM) 10 MCG Tab Place vaginally.     . Ibuprofen-Diphenhydramine Cit (ADVIL PM PO) Take by mouth as needed.     Marland Kitchen levothyroxine (SYNTHROID, LEVOTHROID) 50 MCG tablet Take 50 mcg by mouth Once a day at 6:00am.     . nitroglycerin (NITROSTAT) 0.4 MG SL tablet Place 1 tablet (0.4 mg total) under the tongue as needed for Chest pain (q28min x3. call MD or 911 if pain persists). 25 tablet 3   . famotidine (PEPCID) 20 MG tablet Take 1 tablet (20 mg total) by mouth 2 (two) times daily. 60 tablet 3     No current facility-administered medications for this visit.       Review of Systems     General:+ fatigue. Not Present-  Weight Gain and Weight Loss.  Respiratory:Not Present- Bloody sputum, Cough, Dyspnea and Wheezing.  Cardiovascular:Not Present- Calf, thigh or buttock pain with walking, Difficulty Breathing Lying Down, Edema, Awakening Short of Breath and Palpitations.  Gastrointestinal:Not Present- Bloody Stool, Constipation, Diarrhea, Hematemesis, Indigestion, Nausea and Vomiting.  Neurological:+ dizziness, Not Present-Syncope and Weakness.    Physical Exam     Filed Vitals:    12/03/15 1438   BP: 135/68   Pulse: 91  Height: 1.626 m (5\' 4" )   Weight: 64.864 kg (143 lb)       General:  Patient appears their stated age, well-nourished.  Alert and in no apparent distress.  Eyes: No  conjunctivitis, no purulent discharge, no lid lag  ENT:  Hearing grossly intact, Nares patent bilaterally, Lips moist, color appropriate for race.  Respiratory: Clear to auscultation and percussion throughout. Respiratory effort unlabored, chest expansion symmetric.    Cardio: Regular rate and rhythm. Normal S1/S2, no m/r/g.  No carotid bruits or thrills, no JVD.  Extremities: warm, pulses 2+ dp b/l, no peripheral edema; R wrist cath site is clean, no swelling, b/l radial pulses equal and normal.   GI: Soft, nondistended, nontender.  No guarding or rebound.  Skin: Color appropriate for race, Skin warm, dry, and intact; mild varicose veins in the b/l lower legs.   Psychiatric: Good insight and judgment, oriented to person, place, and time    Labs   08/03/13: TSH 0.2, FT4 2.41  08/09/13: K 4.5, Cr. 0.72, LFT normal, Hg 12.8  08/16/15: Hg 12.1, plt 212, wbc 8.0, BUN 17, Cr. 0.63, Na 139, K 4.2, glucose 208, LFT normal,   04/16/2013: T. Chol 179, Trig 210, HDL 46, LDL 91 mg/dl  09/02/08: K 4.3, cr. 0.9, LFT normal, TSH 2.5, T4 9.6, UA negative.  EKG   ECG 10/20/14: Atrial paced rhythm. No ST/T abnormalities.   ECG 03/25/15: Atrial paced rhythm, no sig ST/T abnormalities.   ECG 08/12/15: Atrial paced rhythm, no sig ST/T abnormalities.   ECG 09/02/15: Atrial paced rhythm, no sig ST/T abnormalities.  ECG 11/11/15: Atrial paced rhythm, nonspecific T wave abnormalities.   ECG 12/03/15: Atrial paced rhythm,nonspecific T wave abnormalities.   Follow-Up     Return in about 2 weeks (around 12/17/2015).    Warmest Regards,    Kelli Hope, MD [facility]  [facility]

## 2015-12-10 ENCOUNTER — Ambulatory Visit (INDEPENDENT_AMBULATORY_CARE_PROVIDER_SITE_OTHER): Payer: Medicare Other | Admitting: Cardiovascular Disease

## 2015-12-10 DIAGNOSIS — I472 Ventricular tachycardia, unspecified: Secondary | ICD-10-CM

## 2015-12-10 DIAGNOSIS — Z950 Presence of cardiac pacemaker: Secondary | ICD-10-CM

## 2015-12-10 DIAGNOSIS — R001 Bradycardia, unspecified: Secondary | ICD-10-CM

## 2015-12-10 DIAGNOSIS — I480 Paroxysmal atrial fibrillation: Secondary | ICD-10-CM

## 2015-12-13 ENCOUNTER — Other Ambulatory Visit: Payer: Medicare Other

## 2015-12-14 ENCOUNTER — Ambulatory Visit: Payer: Medicare Other

## 2015-12-16 ENCOUNTER — Ambulatory Visit: Payer: Medicare Other | Attending: Cardiovascular Disease

## 2015-12-16 ENCOUNTER — Telehealth (INDEPENDENT_AMBULATORY_CARE_PROVIDER_SITE_OTHER): Payer: Self-pay | Admitting: Cardiovascular Disease

## 2015-12-16 ENCOUNTER — Ambulatory Visit (INDEPENDENT_AMBULATORY_CARE_PROVIDER_SITE_OTHER): Payer: Medicare Other | Admitting: Cardiovascular Disease

## 2015-12-16 ENCOUNTER — Encounter (INDEPENDENT_AMBULATORY_CARE_PROVIDER_SITE_OTHER): Payer: Self-pay | Admitting: Cardiovascular Disease

## 2015-12-16 VITALS — BP 116/69 | HR 64 | Ht 64.0 in | Wt 134.0 lb

## 2015-12-16 DIAGNOSIS — Z950 Presence of cardiac pacemaker: Secondary | ICD-10-CM

## 2015-12-16 DIAGNOSIS — R072 Precordial pain: Secondary | ICD-10-CM

## 2015-12-16 DIAGNOSIS — R42 Dizziness and giddiness: Secondary | ICD-10-CM

## 2015-12-16 DIAGNOSIS — I6522 Occlusion and stenosis of left carotid artery: Secondary | ICD-10-CM | POA: Insufficient documentation

## 2015-12-16 DIAGNOSIS — I480 Paroxysmal atrial fibrillation: Secondary | ICD-10-CM

## 2015-12-16 NOTE — Progress Notes (Signed)
[facility]    Chief Complaint   Patient presents with   . Atrial Fibrillation     Pt is here for 2 week follow up. Pt denies any cardiac symptoms a this time.       Assessment and Plan   1. Dizziness  Consider noncardiac  Pt reports symptoms starting after starting on Protonix; improved after stopping Protonix.   Not orthostatic today in clinic  Carotid artery duplex showed mild dz.   - stop protonix  -Tolerating Pepcid 20 mg po bid well, Maalox PRN; continue meds and follow with Dr. Ramiro Harvest for further instructions.     2. Chest pain  Consider due to GERD/esophageal spasm; +/- coronary artery spasm/ myocardial bridge.   Admission on 08/16/15 in [facility], s/p LHC, started on Protonix and Imdur.   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   Now reports dizziness after taking Protonix, switched to Pepcid with relatively good control.   - Continue Pepcid, Maalox PRN   - Follow GERD/ esophageal spasm with Dr. Ramiro Harvest.   - Encourage exercising.     3. Paroxysmal atrial fibrillation  CHADS2-VasC score 2. CHADS2 score 1  Flecainide- Tinnitus. Pt's insurance does not cover Eliquis or Xarelto. Pt refused AC with clear understanding of the potential risk of stroke.    Stable in atrial paced rhythm, asymptomatic, on ECASA 325 mg po qd.   - Continue current meds.     4. Pacemaker  07/17/13 pacemaker check: 80% A paced, AT/AF burden 0%, < 1% since 09/24/2009.  Working well  - follow in pacemaker clinic    5. Fatigue, unspecified type  02/02/14 nuclear stress test: LVEF 72%, no ischemia  Consider multifactorial; improved after taking Melatonin and exercising   - Encourage continuing  exercising.     6. Hypersomnia/ insomnia  frequently waking up during the night,now improved after taking Melatonin.    - Continue Melatonin    I have spent 15 minutes with the pt, 50% time for counseling and education.      Diagnostic Studies   08/20/13 Echo: nl LVSF, borderline LVH, nl LA size, no sig valvular abnormalities.  07/17/13 pacemaker check: 80% A paced, AT/AF burden 0%, < 1% since 09/24/2009.  09/21/11 nuclear stress: no ischemia  Lexiscan nuclear stress test 08/11/15: a small sized area of  ischemia in the anterior wall, LVEF 75%.   Carotid artery duplex 12/16/15:  Normal R carotid artery, mild dz on the left side.   History of Present Illness   82 y/o female with a PMH of hypthyrodism, pAfib, s/p PPM , insomnia, prolapsed uterus, and recent ER visit for CP on 08/07/15.     Pt states that she has been better since the previous visit with improved dizziness. No CP, SOB or palpitations. The GERD is in relatively good control after taking Pepcid.     Past Medical History     Past Medical History   Diagnosis Date   . Atrial fibrillation    . Ventricular tachycardia    . Hypothyroidism    . Atrial tachycardia    . Hyperlipidemia    . Prolapse of uterus    . Vertigo    . PNA (pneumonia)    . Left arm pain    . SOB (shortness of breath)    . Chest pain    . Pacemaker    .  Arrhythmia      Bradycardia   . Malignant neoplasm of skin      Basal cell carcinoma - upper lip - Mohs surgery, hairline and nose   . Abnormal vision      Wears reading glasses   . Constipation        Past Surgical History     Past Surgical History   Procedure Laterality Date   . Back surgery  1980   . Mohs surgery     . Insert / replace / remove pacemaker  October 2011   . Dental surgery       Root canal   . Cardiac catheterization  08/16/2015       Social History     Social History     Social History   . Marital Status: Married     Spouse Name: N/A   . Number of Children: N/A   . Years of Education: N/A     Occupational History   . Not on file.     Social History Main Topics   . Smoking status: Never Smoker    . Smokeless tobacco: Not on file   . Alcohol Use: Yes      Comment: Glass of wine occasionally   . Drug Use: No   . Sexual Activity: Not on file      Other Topics Concern   . Not on file     Social History Narrative       Family History     History reviewed. No pertinent family history.    Allergies     No Known Allergies    Medications     Current Outpatient Prescriptions   Medication Sig Dispense Refill   . aspirin EC 325 MG EC tablet Take 1 tablet (325 mg total) by mouth daily. 90 tablet 3   . atorvastatin (LIPITOR) 40 MG tablet 60 MG DAILY  5   . calcium carbonate (CALTRATE) 600 MG tablet Take 600 mg by mouth.     . Calcium-Magnesium-Vitamin D (CALCIUM 500 PO) Take by mouth.     . CRANBERRY PO Take by mouth.     . Estradiol (YUVAFEM) 10 MCG Tab Place vaginally.     . famotidine (PEPCID) 20 MG tablet Take 1 tablet (20 mg total) by mouth 2 (two) times daily. 60 tablet 3   . Ibuprofen-Diphenhydramine Cit (ADVIL PM PO) Take by mouth as needed.     Marland Kitchen levothyroxine (SYNTHROID, LEVOTHROID) 50 MCG tablet Take 50 mcg by mouth Once a day at 6:00am.     . nitroglycerin (NITROSTAT) 0.4 MG SL tablet Place 1 tablet (0.4 mg total) under the tongue as needed for Chest pain (q36min x3. call MD or 911 if pain persists). 25 tablet 3     No current facility-administered medications for this visit.       Review of Systems     General:+ fatigue. Not Present-  Weight Gain and Weight Loss.  Respiratory:Not Present- Bloody sputum, Cough, Dyspnea and Wheezing.  Cardiovascular:Not Present- Calf, thigh or buttock pain with walking, Difficulty Breathing Lying Down, Edema, Awakening Short of Breath and Palpitations.  Gastrointestinal:+ heart burn- largely resolved. Not Present- Bloody Stool, Constipation, Diarrhea, Hematemesis, Indigestion, Nausea and Vomiting.  Neurological:+ dizziness- improved, Not Present-Syncope and Weakness.    Physical Exam     Filed Vitals:    12/16/15 1447   BP: 116/69   Pulse: 64   Height: 1.626 m (5\' 4" )   Weight: 60.782 kg (  134 lb)       General:  Patient appears their stated age, well-nourished.  Alert and in no apparent distress.  Eyes: No  conjunctivitis, no purulent discharge, no lid lag  ENT:  Hearing grossly intact, Nares patent bilaterally, Lips moist, color appropriate for race.  Respiratory: Clear to auscultation and percussion throughout. Respiratory effort unlabored, chest expansion symmetric.    Cardio: Regular rate and rhythm. Normal S1/S2, no m/r/g.  No carotid bruits or thrills, no JVD.  Extremities: warm, pulses 2+ dp b/l, no peripheral edema; R wrist cath site is clean, no swelling, b/l radial pulses equal and normal.   GI: Soft, nondistended, nontender.  No guarding or rebound.  Skin: Color appropriate for race, Skin warm, dry, and intact; mild varicose veins in the b/l lower legs.   Psychiatric: Good insight and judgment, oriented to person, place, and time    Labs   08/03/13: TSH 0.2, FT4 2.41  08/09/13: K 4.5, Cr. 0.72, LFT normal, Hg 12.8  08/16/15: Hg 12.1, plt 212, wbc 8.0, BUN 17, Cr. 0.63, Na 139, K 4.2, glucose 208, LFT normal,   04/16/2013: T. Chol 179, Trig 210, HDL 46, LDL 91 mg/dl  09/02/08: K 4.3, cr. 0.9, LFT normal, TSH 2.5, T4 9.6, UA negative.  EKG   ECG 10/20/14: Atrial paced rhythm. No ST/T abnormalities.   ECG 03/25/15: Atrial paced rhythm, no sig ST/T abnormalities.   ECG 08/12/15: Atrial paced rhythm, no sig ST/T abnormalities.   ECG 09/02/15: Atrial paced rhythm, no sig ST/T abnormalities.  ECG 11/11/15: Atrial paced rhythm, nonspecific T wave abnormalities.   ECG 12/03/15: Atrial paced rhythm,nonspecific T wave abnormalities.   ECG 12/16/15: Atrial paced rhythm, nonspecific T wave abnormalities.   Follow-Up     Return in about 6 months (around 06/16/2016).    Warmest Regards,    Kelli Hope, MD [facility]  [facility]

## 2015-12-16 NOTE — Telephone Encounter (Signed)
Carotid duplex study shows Normal right side.  Left side with mild disease.

## 2015-12-16 NOTE — Telephone Encounter (Signed)
Pt's husband has been advised and expressed understanding. Pt has appt this afternoon with QZ.

## 2015-12-27 DIAGNOSIS — N390 Urinary tract infection, site not specified: Secondary | ICD-10-CM

## 2015-12-27 HISTORY — DX: Urinary tract infection, site not specified: N39.0

## 2016-01-03 NOTE — Progress Notes (Signed)
SEE SCANNED DEVICE CHECK, DOS 12/10/2015    REVIEWED BY: Ferd Hibbs, MD, [facility], [facility]  The patients device interrogation was personally reviewed by me, all assessments were noted and patient was notified of findings and changes made, if needed.  Continue with device interrogations as scheduled.     ENTERED BY: Earley Brooke, MA

## 2016-01-18 ENCOUNTER — Encounter (INDEPENDENT_AMBULATORY_CARE_PROVIDER_SITE_OTHER): Payer: Medicare Other | Admitting: Cardiovascular Disease

## 2016-01-18 ENCOUNTER — Ambulatory Visit (INDEPENDENT_AMBULATORY_CARE_PROVIDER_SITE_OTHER): Payer: Medicare Other | Admitting: Cardiovascular Disease

## 2016-01-18 ENCOUNTER — Encounter (INDEPENDENT_AMBULATORY_CARE_PROVIDER_SITE_OTHER): Payer: Self-pay | Admitting: Cardiovascular Disease

## 2016-01-18 VITALS — BP 145/81 | HR 78 | Ht 64.5 in | Wt 138.0 lb

## 2016-01-18 DIAGNOSIS — R0609 Other forms of dyspnea: Secondary | ICD-10-CM

## 2016-01-18 DIAGNOSIS — Z950 Presence of cardiac pacemaker: Secondary | ICD-10-CM

## 2016-01-18 DIAGNOSIS — R5383 Other fatigue: Secondary | ICD-10-CM

## 2016-01-18 DIAGNOSIS — R0600 Dyspnea, unspecified: Secondary | ICD-10-CM

## 2016-01-18 DIAGNOSIS — I480 Paroxysmal atrial fibrillation: Secondary | ICD-10-CM

## 2016-01-18 DIAGNOSIS — E785 Hyperlipidemia, unspecified: Secondary | ICD-10-CM

## 2016-01-18 DIAGNOSIS — R42 Dizziness and giddiness: Secondary | ICD-10-CM

## 2016-01-18 MED ORDER — FLECAINIDE ACETATE 50 MG PO TABS
50.0000 mg | ORAL_TABLET | Freq: Two times a day (BID) | ORAL | Status: DC
Start: 2016-01-18 — End: 2016-01-18

## 2016-01-18 MED ORDER — DRONEDARONE HCL 400 MG PO TABS
400.0000 mg | ORAL_TABLET | Freq: Two times a day (BID) | ORAL | Status: DC
Start: 2016-01-18 — End: 2016-01-20

## 2016-01-18 NOTE — Progress Notes (Signed)
[facility]    Chief Complaint   Patient presents with   . Atrial Fibrillation     Pt is here for 3 month follow up. pt wants to discuss starting blood thinner.       Assessment and Plan   1. Fatigue/ dizziness, and periodic DOE  -Consider multifactorial but recurrent paroxysmal Afib is likely the cause.   - Side effect from statin may also contribute to the fatigue.   - Optimize rhythm control, add Multaq 400 mg po bid.   - Continue holding atorvastatin.     2. Paroxysmal atrial fibrillation  CHADS2-VasC score 2. CHADS2 score 1  Flecainide- Tinnitus. Pt's insurance does not cover Eliquis or Xarelto. Pt refused AC with clear understanding of the potential risk of stroke.    Pt reports intermittent fatigue, dizziness and DOE, PPM check showed recurrent pAfib.   Today in atrial paced rhythm, on ECASA 325 mg po qd.   - Add Multaq 400 mg po bid for optimizing rhythm control  - ECG this Thursday, and in one week.     3. Pacemaker  07/17/13 pacemaker check: 80% A paced, AT/AF burden 0%, < 1% since 09/24/2009.  12/10/15 PPM check : A paced rhythm, pAfib with RVR, lasting up to 53 minutes.   Functioning well.   - follow in pacemaker clinic    4. Dizziness  Pt reports symptoms starting after starting on Protonix; improved after stopping Protonix.   Carotid artery duplex showed mild dz.   Now pAfib likely contributes to the symptoms.   - stopped protonix  -Tolerating Pepcid 20 mg po bid well, Maalox PRN; continue meds and follow with Dr. Ramiro Harvest for further instructions.  - Optimize rhythm control as above.      5. Chest pain  Consider due to GERD/esophageal spasm; +/- coronary artery spasm/ myocardial bridge.   Admission on 08/16/15 in [facility], s/p LHC, started on Protonix and Imdur.   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   - Continue Pepcid, Maalox PRN   - Follow GERD/ esophageal spasm with Dr.  Ramiro Harvest.   - Encourage exercising.     6. Hyperlipidemia  Used to be on Atorvastatin 40 mg po qd, tolerated well,  Atorvastatin 60 mg po qhs- fatigue, weakness, and muscle pain.   Pt does not have CAD, at her age, no indication for aggressive lipid control. Goal LDL < 130 mg/dl.   - Consider a contributing factor for fatigue.   - Holding Atorvastatin for another month and observe effect for fatigue. Advise not to start Crestor at this time.     I have spent 25 minutes with the pt, 50% time for counseling and education.       5. Fatigue, unspecified type  02/02/14 nuclear stress test: LVEF 72%, no ischemia  Consider multifactorial; improved after taking Melatonin and exercising   - Encourage continuing  exercising.     6. Hypersomnia/ insomnia  frequently waking up during the night,now improved after taking Melatonin.    - Continue Melatonin    I have spent 15 minutes with the pt, 50% time for counseling and education.     Diagnostic Studies   08/20/13 Echo: nl LVSF, borderline LVH, nl LA size, no sig valvular abnormalities.  07/17/13 pacemaker check: 80% A paced, AT/AF burden 0%, < 1% since 09/24/2009.  09/21/11 nuclear stress: no ischemia  Lexiscan nuclear  stress test 08/11/15: a small sized area of  ischemia in the anterior wall, LVEF 75%.   Carotid artery duplex 12/16/15:  Normal R carotid artery, mild dz on the left side.     History of Present Illness   82 y/o female with a PMH of hypthyrodism, pAfib, s/p PPM , insomnia, prolapsed uterus, and recent ER visit for CP on 08/07/15.     Pt states that she has been feeling more tired than before, with mild dizziness, SOB with mild exertion (such as shopping). However, at times, she felt well. No CP, palpitations or SOB at rest.  She was taking Atorvastatin 40 mg po qd , tolerated well. The med recently increased 60 mg po qd, c/b leg weakness and pain. The med was held for 2 weeks and the symptoms resolved with more energy. She plan to start taking Crestor tomorrow.  She  will have Cystocele surgery soon, which needs to hold ASA for 7 days. No leg swelling, PND or orthopnea.      Past Medical History     Past Medical History   Diagnosis Date   . Atrial fibrillation    . Ventricular tachycardia    . Hypothyroidism    . Atrial tachycardia    . Hyperlipidemia    . Prolapse of uterus    . Vertigo    . PNA (pneumonia)    . Left arm pain    . SOB (shortness of breath)    . Chest pain    . Pacemaker    . Arrhythmia      Bradycardia   . Malignant neoplasm of skin      Basal cell carcinoma - upper lip - Mohs surgery, hairline and nose   . Abnormal vision      Wears reading glasses   . Constipation        Past Surgical History     Past Surgical History   Procedure Laterality Date   . Back surgery  1980   . Mohs surgery     . Insert / replace / remove pacemaker  October 2011   . Dental surgery       Root canal   . Cardiac catheterization  08/16/2015       Social History     Social History     Social History   . Marital Status: Married     Spouse Name: N/A   . Number of Children: N/A   . Years of Education: N/A     Occupational History   . Not on file.     Social History Main Topics   . Smoking status: Never Smoker    . Smokeless tobacco: Not on file   . Alcohol Use: Yes      Comment: Glass of wine occasionally   . Drug Use: No   . Sexual Activity: Not on file     Other Topics Concern   . Not on file     Social History Narrative       Family History     History reviewed. No pertinent family history.    Allergies     No Known Allergies    Medications     Current Outpatient Prescriptions   Medication Sig Dispense Refill   . aspirin EC 325 MG EC tablet Take 1 tablet (325 mg total) by mouth daily. 90 tablet 3   . Cholecalciferol (VITAMIN D PO) Take by mouth daily.     Marland Kitchen CRANBERRY PO  Take by mouth.     . Estradiol (YUVAFEM) 10 MCG Tab Place vaginally.     . famotidine (PEPCID) 20 MG tablet Take 1 tablet (20 mg total) by mouth 2 (two) times daily. 60 tablet 3   . levothyroxine (SYNTHROID, LEVOTHROID)  50 MCG tablet Take 50 mcg by mouth Once a day at 6:00am.     . MELATONIN PO Take by mouth nightly.     . nitroglycerin (NITROSTAT) 0.4 MG SL tablet Place 1 tablet (0.4 mg total) under the tongue as needed for Chest pain (q55min x3. call MD or 911 if pain persists). 25 tablet 3   . flecainide (TAMBOCOR) 50 MG tablet Take 1 tablet (50 mg total) by mouth 2 (two) times daily. 60 tablet 5   . Ibuprofen-Diphenhydramine Cit (ADVIL PM PO) Take by mouth as needed.       No current facility-administered medications for this visit.       Review of Systems     General:+ fatigue. Not Present-  Weight Gain and Weight Loss.  Respiratory:Not Present- Bloody sputum, Cough, Dyspnea and Wheezing.  Cardiovascular:Not Present- Calf, thigh or buttock pain with walking, Difficulty Breathing Lying Down, Edema, Awakening Short of Breath and Palpitations.  Gastrointestinal:+ heart burn- largely resolved. Not Present- Bloody Stool, Constipation, Diarrhea, Hematemesis, Indigestion, Nausea and Vomiting.  Neurological:+ dizziness- improved, Not Present-Syncope and Weakness.    Physical Exam     Filed Vitals:    01/18/16 1606   BP: 145/81   Pulse: 78   Height: 1.638 m (5' 4.5")   Weight: 62.596 kg (138 lb)       General:  Patient appears their stated age, well-nourished.  Alert and in no apparent distress.  Eyes: No conjunctivitis, no purulent discharge, no lid lag  ENT:  Hearing grossly intact, Nares patent bilaterally, Lips moist, color appropriate for race.  Respiratory: Clear to auscultation and percussion throughout. Respiratory effort unlabored, chest expansion symmetric.    Cardio: Regular rate and rhythm. Normal S1/S2, no m/r/g.  No carotid bruits or thrills, no JVD.  Extremities: warm, pulses 2+ dp b/l, no peripheral edema; R wrist cath site is clean, no swelling, b/l radial pulses equal and normal.   GI: Soft, nondistended, nontender.  No guarding or rebound.  Skin: Color appropriate for race, Skin warm, dry, and intact; mild  varicose veins in the b/l lower legs.   Psychiatric: Good insight and judgment, oriented to person, place, and time    Labs   08/03/13: TSH 0.2, FT4 2.41  08/09/13: K 4.5, Cr. 0.72, LFT normal, Hg 12.8  08/16/15: Hg 12.1, plt 212, wbc 8.0, BUN 17, Cr. 0.63, Na 139, K 4.2, glucose 208, LFT normal,   04/16/2013: T. Chol 179, Trig 210, HDL 46, LDL 91 mg/dl  5/95/6 : TSH  3.87, FT4 1.83. T. Chol 185, HDL 41, LDL 115, Trig 145  12/03/13: K 4.3, cr. 0.9, LFT normal, TSH 2.5, T4 9.6, UA negative.  01/13/16: hg  13.2, plt 256, BUN 14, Cr. 0.66, K 5.2, Na 142, LFT normal.   EKG   ECG 10/20/14: Atrial paced rhythm. No ST/T abnormalities.   ECG 03/25/15: Atrial paced rhythm, no sig ST/T abnormalities.   ECG 08/12/15: Atrial paced rhythm, no sig ST/T abnormalities.   ECG 09/02/15: Atrial paced rhythm, no sig ST/T abnormalities.  ECG 11/11/15: Atrial paced rhythm, nonspecific T wave abnormalities.   ECG 12/03/15: Atrial paced rhythm,nonspecific T wave abnormalities.   ECG 12/16/15: Atrial paced rhythm, nonspecific T wave abnormalities.  ECG 01/18/16: Atrial paced rhythm at 60 bpm, nonspecific T wave abnormalities, QTc 424 msec.    Follow-Up     Return in about 1 week (around 01/25/2016).    Warmest Regards,    Kelli Hope, MD [facility]  [facility]

## 2016-01-20 ENCOUNTER — Ambulatory Visit (INDEPENDENT_AMBULATORY_CARE_PROVIDER_SITE_OTHER): Payer: Medicare Other | Admitting: Cardiovascular Disease

## 2016-01-20 DIAGNOSIS — I480 Paroxysmal atrial fibrillation: Secondary | ICD-10-CM

## 2016-01-20 MED ORDER — FLECAINIDE ACETATE 50 MG PO TABS
50.0000 mg | ORAL_TABLET | Freq: Two times a day (BID) | ORAL | Status: DC
Start: 2016-01-20 — End: 2016-01-27

## 2016-01-20 NOTE — Progress Notes (Signed)
Pt in fact started on Flecainide 50 mg po bid.   Multaq has a co-pay over $600 .   ECG today showed normal QTc 410 msec.   Pt currently tolerating Flecainide well without recurrent tinnitus.  - follow in one week.

## 2016-01-21 ENCOUNTER — Encounter (HOSPITAL_BASED_OUTPATIENT_CLINIC_OR_DEPARTMENT_OTHER): Payer: Self-pay

## 2016-01-21 ENCOUNTER — Telehealth: Payer: Medicare Other

## 2016-01-21 NOTE — Pre-Procedure Instructions (Signed)
   [id number] call, pt wanted to speak to nurse [facility], email sent   Called pt back and she spoke with Thayer Ohm

## 2016-01-21 NOTE — Pre-Procedure Instructions (Addendum)
   Carolina Sink @ PCP to fax labs, EKG and medical clearance to [facility].   CIED form faxed to Dr. Ferd Hibbs office.   Mobility Assistance e-mailed for W/C on arrival. Beatrix Fetters and [employer] also notified via E-mail.   Cardio. note of 5/23 states to hold ASA seven days prior to surgery.   Stress test 08/11/15 in EPIC.   Pt had LHC 08/16/15   Pt has not used Nitrostat in the past 2 months

## 2016-01-21 NOTE — Pre-Procedure Instructions (Signed)
Margaret Shea from [facility] called, she will fax labs and preop clearance. ekg was done with cardiologist Dr Dion Body.

## 2016-01-26 ENCOUNTER — Encounter (INDEPENDENT_AMBULATORY_CARE_PROVIDER_SITE_OTHER): Payer: Medicare Other | Admitting: Cardiovascular Disease

## 2016-01-27 ENCOUNTER — Telehealth (INDEPENDENT_AMBULATORY_CARE_PROVIDER_SITE_OTHER): Payer: Self-pay | Admitting: Cardiovascular Disease

## 2016-01-27 ENCOUNTER — Other Ambulatory Visit (INDEPENDENT_AMBULATORY_CARE_PROVIDER_SITE_OTHER): Payer: Self-pay | Admitting: Cardiovascular Disease

## 2016-01-27 DIAGNOSIS — I480 Paroxysmal atrial fibrillation: Secondary | ICD-10-CM

## 2016-01-27 MED ORDER — PROPAFENONE HCL ER 225 MG PO CP12
225.0000 mg | ORAL_CAPSULE | Freq: Two times a day (BID) | ORAL | Status: DC
Start: 2016-01-27 — End: 2016-03-06

## 2016-01-27 NOTE — Telephone Encounter (Signed)
Noted. Spoke with pt and she verbalized understanding. Transferred to scheduling to make appointment for EKG on Tuesday.

## 2016-01-27 NOTE — Telephone Encounter (Signed)
I have ordered Propafenone 225 mg po bid for her.   Please instruct her to start taking it next Monday, and come to clinic for an ECG the next day on Tuesday.   Needs MD read for QTc before leaving.   Please help schedule.   I will see her one week later.   Please instruct her to stop taking Flecainide now.     Thanks,  Dr. Dion Body

## 2016-01-27 NOTE — Telephone Encounter (Signed)
She is has been taking Flecainide.   She is experiencing dizziness, weak legs, and tinnitus.     She would like a prescription for something else.     Please advise.     Thank you.     OV 01/18/2016

## 2016-02-01 ENCOUNTER — Ambulatory Visit (INDEPENDENT_AMBULATORY_CARE_PROVIDER_SITE_OTHER): Payer: Medicare Other | Admitting: Internal Medicine

## 2016-02-01 DIAGNOSIS — I480 Paroxysmal atrial fibrillation: Secondary | ICD-10-CM

## 2016-02-01 NOTE — Procedures (Signed)
ECG: atrial pacing at 68bpm with non-specific ST abnormalities. QTc within normal limts

## 2016-02-02 ENCOUNTER — Ambulatory Visit (INDEPENDENT_AMBULATORY_CARE_PROVIDER_SITE_OTHER): Payer: Medicare Other | Admitting: Cardiovascular Disease

## 2016-02-02 ENCOUNTER — Encounter (INDEPENDENT_AMBULATORY_CARE_PROVIDER_SITE_OTHER): Payer: Self-pay | Admitting: Cardiovascular Disease

## 2016-02-02 VITALS — BP 103/65 | HR 65 | Ht 64.0 in | Wt 143.0 lb

## 2016-02-02 DIAGNOSIS — R42 Dizziness and giddiness: Secondary | ICD-10-CM

## 2016-02-02 DIAGNOSIS — I480 Paroxysmal atrial fibrillation: Secondary | ICD-10-CM

## 2016-02-02 DIAGNOSIS — R5383 Other fatigue: Secondary | ICD-10-CM

## 2016-02-02 NOTE — Pre-Procedure Instructions (Signed)
CIED form faxed to [facility]

## 2016-02-02 NOTE — Pre-Procedure Instructions (Signed)
Phone request to PMD office of Dr. Ramiro Harvest for medical clearance - Osborne Casco will fax to [phone]

## 2016-02-02 NOTE — Progress Notes (Signed)
[facility]    Chief Complaint   Patient presents with   . Atrial Fibrillation     1 week fu to EKG. Pt. denies any new.current cardiac symptoms.       Assessment and Plan   1. Fatigue/ dizziness, and periodic DOE  -Consider multifactorial including pafib with RVR, neurological problem- vertigo, or ENT problem.   - Neurology referral for further evaluation.   - Propafenone did not make dizziness wosre, continue at current dosage.   - Holding Atorvastatin did not help with the symptoms, consider restarting the med.   - stopping Protonix helped with dizziness.     2. Paroxysmal atrial fibrillation  CHADS2-VasC score 2. CHADS2 score 1  Flecainide- Tinnitus. Pt's insurance does not cover Eliquis or Xarelto. Pt refused AC with clear understanding of the potential risk of stroke.   Multaq- high copay   Pt reports intermittent fatigue, dizziness and DOE, PPM check showed recurrent pAfib.   Today in atrial paced rhythm, on ECASA 325 mg po qd.   - Tolerated the Propafenone well. QTc prolonged , will closely observe.     3. Pacemaker  07/17/13 pacemaker check: 80% A paced, AT/AF burden 0%, < 1% since 09/24/2009.  12/10/15 PPM check : A paced rhythm, pAfib with RVR, lasting up to 53 minutes.   Functioning well.   - follow in pacemaker clinic    4. Chest pain  Consider due to GERD/esophageal spasm; +/- coronary artery spasm/ myocardial bridge.   Admission on 08/16/15 in [facility], s/p LHC, started on Protonix and Imdur.   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   Asymptomatic  - Continue Pepcid, Maalox PRN   - Follow GERD/ esophageal spasm with Dr. Ramiro Harvest.   - Encourage exercising.     6. Hyperlipidemia  Used to be on Atorvastatin 40 mg po qd, tolerated well,  Atorvastatin 60 mg po qhs- fatigue, weakness, and muscle pain.   Pt does not have CAD, at her age, no indication for aggressive lipid control.  Goal LDL < 130 mg/dl.   - Consider a contributing factor for fatigue.   - Holding Atorvastatin, currently no obvious effect on fatigue. Advise not to start Crestor at this time.     5. Fatigue, unspecified type  02/02/14 nuclear stress test: LVEF 72%, no ischemia  Consider multifactorial; improved after taking Melatonin and exercising   - Encourage continuing  exercising.     6. Hypersomnia/ insomnia  frequently waking up during the night,now improved after taking Melatonin.    - Continue Melatonin    I have spent 15 minutes with the pt, 50% time for counseling and education.     Diagnostic Studies   08/20/13 Echo: nl LVSF, borderline LVH, nl LA size, no sig valvular abnormalities.  07/17/13 pacemaker check: 80% A paced, AT/AF burden 0%, < 1% since 09/24/2009.  09/21/11 nuclear stress: no ischemia  Lexiscan nuclear stress test 08/11/15: a small sized area of  ischemia in the anterior wall, LVEF 75%.   Carotid artery duplex 12/16/15:  Normal R carotid artery, mild dz on the left side.   History of Present Illness   82 y/o female with a PMH of hypthyrodism, pAfib, s/p PPM , insomnia, prolapsed uterus, and recent ER visit for CP on 08/07/15.     Pt reported tinnitus and the Flecainide was discontinued. She was started on Propafenone. Pt reports  continuing feeling dizzy, which has been chronic, not worsened by taking Propafenone. She still feels tired, no change. she never had Neuro or ENT eval before. She  is going for the pelvic prolapse surgery next week.     Past Medical History     Past Medical History   Diagnosis Date   . Atrial fibrillation    . Ventricular tachycardia    . Hypothyroidism    . Atrial tachycardia    . Hyperlipidemia    . Prolapse of uterus    . Vertigo    . Left arm pain    . SOB (shortness of breath)    . Chest pain    . Pacemaker    . Arrhythmia      Bradycardia   . Constipation    . PNA (pneumonia) 2016     hospitalized over night for pneumonia   . Urinary tract infection 12/2015     on med, no  symptoms presently   . Gastroesophageal reflux disease      on med       Past Surgical History     Past Surgical History   Procedure Laterality Date   . Back surgery  1980   . Mohs surgery     . Insert / replace / remove pacemaker  October 2011   . Dental surgery       Root canal   . Cardiac catheterization  08/16/2015   . Hysterectomy  2004   . Adenoidectomy       age7   . Tonsillectomy       age 36       Social History     Social History     Social History   . Marital Status: Married     Spouse Name: N/A   . Number of Children: N/A   . Years of Education: N/A     Occupational History   . Not on file.     Social History Main Topics   . Smoking status: Never Smoker    . Smokeless tobacco: Not on file   . Alcohol Use: 0.6 oz/week     1 Glasses of wine per week      Comment: Glass of wine occasionally   . Drug Use: No   . Sexual Activity: Not on file     Other Topics Concern   . Not on file     Social History Narrative       Family History     History reviewed. No pertinent family history.    Allergies     No Known Allergies    Medications     Current Outpatient Prescriptions   Medication Sig Dispense Refill   . aspirin EC 325 MG EC tablet Take 1 tablet (325 mg total) by mouth daily. 90 tablet 3   . Cholecalciferol (VITAMIN D PO) Take by mouth daily.     Marland Kitchen CRANBERRY PO Take by mouth.     . Estradiol (YUVAFEM) 10 MCG Tab Place vaginally.     . famotidine (PEPCID) 20 MG tablet Take 1 tablet (20 mg total) by mouth 2 (two) times daily. 60 tablet 3   . Ibuprofen-Diphenhydramine Cit (ADVIL PM PO) Take by mouth as needed.     Marland Kitchen levothyroxine (SYNTHROID, LEVOTHROID) 50 MCG tablet Take 50 mcg by mouth Once a day at 6:00am.     . MELATONIN PO Take by mouth nightly.     . nitroglycerin (NITROSTAT) 0.4 MG  SL tablet Place 1 tablet (0.4 mg total) under the tongue as needed for Chest pain (q71min x3. call MD or 911 if pain persists). 25 tablet 3   . propafenone (RYTHMOL SR) 225 MG 12 hr capsule Take 1 capsule (225 mg total) by mouth 2  (two) times daily. 60 capsule 5     No current facility-administered medications for this visit.       Review of Systems     General:+ fatigue. Not Present-  Weight Gain and Weight Loss.  Respiratory:Not Present- Bloody sputum, Cough, Dyspnea and Wheezing.  Cardiovascular:rare short-lasting palpitations. Not Present- Calf, thigh or buttock pain with walking, Difficulty Breathing Lying Down, Edema, Awakening Short of Breath   Gastrointestinal:+ heart burn- largely resolved. Not Present- Bloody Stool, Constipation, Diarrhea, Hematemesis, Indigestion, Nausea and Vomiting.  Neurological:+ dizziness- improved, Not Present-Syncope and Weakness.    Physical Exam     Filed Vitals:    02/02/16 1010   BP: 103/65   Pulse: 65   Height: 1.626 m (5\' 4" )   Weight: 64.864 kg (143 lb)       General:  Patient appears their stated age, well-nourished.  Alert and in no apparent distress.  Eyes: No conjunctivitis, no purulent discharge, no lid lag  ENT:  Hearing grossly intact, Nares patent bilaterally, Lips moist, color appropriate for race.  Respiratory: Clear to auscultation and percussion throughout. Respiratory effort unlabored, chest expansion symmetric.    Cardio: Regular rate and rhythm. Normal S1/S2, no m/r/g.  No carotid bruits or thrills, no JVD.  Extremities: warm, pulses 2+ dp b/l, no peripheral edema; R wrist cath site is clean, no swelling, b/l radial pulses equal and normal.   GI: Soft, nondistended, nontender.  No guarding or rebound.  Skin: Color appropriate for race, Skin warm, dry, and intact; mild varicose veins in the b/l lower legs.   Psychiatric: Good insight and judgment, oriented to person, place, and time    Labs   08/03/13: TSH 0.2, FT4 2.41  08/09/13: K 4.5, Cr. 0.72, LFT normal, Hg 12.8  08/16/15: Hg 12.1, plt 212, wbc 8.0, BUN 17, Cr. 0.63, Na 139, K 4.2, glucose 208, LFT normal,   04/16/2013: T. Chol 179, Trig 210, HDL 46, LDL 91 mg/dl  1/61/0 : TSH  9.60, FT4 1.83. T. Chol 185, HDL 41, LDL 115, Trig  145  12/03/13: K 4.3, cr. 0.9, LFT normal, TSH 2.5, T4 9.6, UA negative.  01/13/16: hg  13.2, plt 256, BUN 14, Cr. 0.66, K 5.2, Na 142, LFT normal.   EKG   ECG 10/20/14: Atrial paced rhythm. No ST/T abnormalities.   ECG 03/25/15: Atrial paced rhythm, no sig ST/T abnormalities.   ECG 08/12/15: Atrial paced rhythm, no sig ST/T abnormalities.   ECG 09/02/15: Atrial paced rhythm, no sig ST/T abnormalities.  ECG 11/11/15: Atrial paced rhythm, nonspecific T wave abnormalities.   ECG 12/03/15: Atrial paced rhythm,nonspecific T wave abnormalities.   ECG 12/16/15: Atrial paced rhythm, nonspecific T wave abnormalities.  ECG 01/18/16: Atrial paced rhythm at 60 bpm, nonspecific T wave abnormalities, QTc 424 msec.    ECG 02/02/16: Atrial paced rhythm, QTc 406 msec.   Follow-Up     Return in about 1 month (around 03/03/2016).    Warmest Regards,    Kelli Hope, MD [facility]  [facility]

## 2016-02-04 ENCOUNTER — Encounter (INDEPENDENT_AMBULATORY_CARE_PROVIDER_SITE_OTHER): Payer: Self-pay

## 2016-02-04 ENCOUNTER — Telehealth (INDEPENDENT_AMBULATORY_CARE_PROVIDER_SITE_OTHER): Payer: Self-pay | Admitting: Cardiovascular Disease

## 2016-02-04 NOTE — Telephone Encounter (Signed)
Pt may proceed to surgery without further CV testing.   Given advanced age and multiple comorbidity, perioperative risk is considered to be low to moderate  - pt on Propafenone, avoid meds interact or causing QT prolongation  - Tele monitoring perioperatively  - Keep BP within 20% of baseline  - May hold ASA as needed perioperatively.     Please let pt know. Please send my last note with addendum to surgeon.     Thanks  Dr. Dion Body

## 2016-02-04 NOTE — Telephone Encounter (Signed)
[place] with [employer] called requesting preop clearance. Needed before 3:30 today    Patient is scheduled for surgery Monday 6/12.    CB# [phone]    Fax  [phone]

## 2016-02-04 NOTE — Telephone Encounter (Signed)
Pt is scheduled for Colpectomy on 6/12 with Dr Rosamaria Lints.  Can pt be cleared for procedure?  Last seen 6/7.  Please advise on risk level and med instructions.  Thank you.    1. Fatigue/ dizziness, and periodic DOE  -Consider multifactorial including pafib with RVR, neurological problem- vertigo, or ENT problem.   - Neurology referral for further evaluation.   - Propafenone did not make dizziness wosre, continue at current dosage.   - Holding Atorvastatin did not help with the symptoms, consider restarting the med.   - stopping Protonix helped with dizziness.     2. Paroxysmal atrial fibrillation  CHADS2-VasC score 2. CHADS2 score 1  Flecainide- Tinnitus. Pt's insurance does not cover Eliquis or Xarelto. Pt refused AC with clear understanding of the potential risk of stroke.   Multaq- high copay   Pt reports intermittent fatigue, dizziness and DOE, PPM check showed recurrent pAfib.   Today in atrial paced rhythm, on ECASA 325 mg po qd.   - Tolerated the Propafenone well. QTc prolonged , will closely observe.     3. Pacemaker  07/17/13 pacemaker check: 80% A paced, AT/AF burden 0%, < 1% since 09/24/2009.  12/10/15 PPM check : A paced rhythm, pAfib with RVR, lasting up to 53 minutes.   Functioning well.   - follow in pacemaker clinic    4. Chest pain  Consider due to GERD/esophageal spasm; +/- coronary artery spasm/ myocardial bridge.   Admission on 08/16/15 in [facility], s/p LHC, started on Protonix and Imdur.   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   Asymptomatic  - Continue Pepcid, Maalox PRN   - Follow GERD/ esophageal spasm with Dr. Ramiro Harvest.   - Encourage exercising.     6. Hyperlipidemia  Used to be on Atorvastatin 40 mg po qd, tolerated well,  Atorvastatin 60 mg po qhs- fatigue, weakness, and muscle pain.   Pt does not have CAD, at her age, no indication for aggressive lipid  control. Goal LDL < 130 mg/dl.   - Consider a contributing factor for fatigue.   - Holding Atorvastatin, currently no obvious effect on fatigue. Advise not to start Crestor at this time.     5. Fatigue, unspecified type  02/02/14 nuclear stress test: LVEF 72%, no ischemia  Consider multifactorial; improved after taking Melatonin and exercising   - Encourage continuing exercising.     6. Hypersomnia/ insomnia  frequently waking up during the night,now improved after taking Melatonin.   - Continue Melatonin

## 2016-02-04 NOTE — Progress Notes (Signed)
Correction in Assessment and plan:  ECG- A- paced rhythm, QTc normal.

## 2016-02-04 NOTE — Pre-Procedure Instructions (Addendum)
   Spoke with Bonita Quin at cardiology . Lida will clarify with cardiologist if pt is cleared for surgery and get back with writer at [phone]   Urgent CIED form faxed  to [phone] per Axel Filler with [facility] requested medical clearance to be faxed to [phone]. She said was faxed before but will fax again   [phone]; spoke with [place] at cardiology, she confirmed receiving CIED form ,[facility] not in office . Will try to call them and fax completed CIED to [phone]   Spoke with Tenny Craw  [employer] rep. Updated on place ,time of surgery

## 2016-02-04 NOTE — Telephone Encounter (Signed)
Letter faxed to [place] [facility] @ # listed below via [facility]>

## 2016-02-06 NOTE — H&P (Addendum)
ADMISSION HISTORY AND PHYSICAL EXAM    Date Time: 02/06/2016 11:51 AM  Patient Name: Margaret Shea  Attending Physician: Selinda Flavin, MD    History of Presenting Illness:     Margaret Shea is a 82 y.o. female  C/o  POP and SUI.  She is not currently sexually active and is interested in proceeding with surgery for her POP; she wants to most effective and simplest surgery that can correct the problem.    Past Medical History:     Past Medical History   Diagnosis Date   . Atrial fibrillation    . Ventricular tachycardia    . Hypothyroidism    . Atrial tachycardia    . Hyperlipidemia    . Prolapse of uterus    . Vertigo    . Left arm pain    . SOB (shortness of breath)    . Chest pain    . Pacemaker    . Arrhythmia      Bradycardia   . Constipation    . PNA (pneumonia) 2016     hospitalized over night for pneumonia   . Urinary tract infection 12/2015     on med, no symptoms presently   . Gastroesophageal reflux disease      on med       Past Surgical History:     Past Surgical History   Procedure Laterality Date   . Back surgery  1980   . Mohs surgery     . Insert / replace / remove pacemaker  October 2011   . Dental surgery       Root canal   . Cardiac catheterization  08/16/2015   . Hysterectomy  2004   . Adenoidectomy       age7   . Tonsillectomy       age 45       Family History:   History reviewed. No pertinent family history.    Social History:     Social History     Social History   . Marital Status: Married     Spouse Name: N/A   . Number of Children: N/A   . Years of Education: N/A     Social History Main Topics   . Smoking status: Never Smoker    . Smokeless tobacco: Not on file   . Alcohol Use: 0.6 oz/week     1 Glasses of wine per week      Comment: Glass of wine occasionally   . Drug Use: No   . Sexual Activity: Not on file     Other Topics Concern   . Not on file     Social History Narrative          Allergies:   No Known Allergies   No allergies to latex or iodine are noted.      Medications:      See medication reconciliation list for full list, including name/dose/route/frequency of meds.    Review of Systems:   A comprehensive review of systems was performed in the office.      Physical Exam:     Gen: A&Ox3, pleasant and in NAD  Lungs: Clear  CV: RR  Abd: Visual Inspection: (normal) visual inspection and not distended. Percussion: (normal) percussion. Palpation: no tenderness, masses, or rebound tenderness and soft and NO CVA tenderness. Hepatic Findings: no tenderness or hepatomegaly. Splenic Findings: no splenomegaly or tenderness. Hernia: none palpable.    Female Genitalia: External: no masses,  atrophy, ulcer, erythema, lesions, lichenification, or scarring and (normal) bartholin's gland. Bladder/Urethra: no urethral discharge or mass and normal meatus and bladder non distended. Vagina: no tenderness, erythema, vesicle(s), ulcers, vaginal mass, vesicovaginal fistula, or rectovaginal fistula and cystocele, rectocele, and atrophy. Cervix: absent. Uterus: absent. Adnexa/Parametria: no parametrial tenderness or mass and no adnexal tenderness or ovarian mass. Pop-Q: Aa: +2, Ba: +4, C: -3, Ap: -1, Bp: -1, genital hiatus [facility]): 3.5, perineal body (pb): 2.0, and total vaginal length (TVL): 8.0. Pelvic Floor: right levator ani normal and left levator ani normal. Rectum: no hemorrhoids or rectal prolapse noted and normal perianal skin.            Uro D:  Technical Comments: +SUI noted starting at instilled into bladder.      @150ml , -CLPP(-47, -50, -56), -VLPP(-22, -34, -49)    @Full  cap w/ cath, +CLPP(+46, -51, +60),+VLPP(-31, -35, +37)    -cough profile.    Impression:    1. Stress Incontinence  2. Intrinsic Sphincter Deficiency  3. Sensory Urgency  4. Stable Bladder        Labs:   H&H: 13.2/42.1  Creat: 0.66    Rads:   N/A    Cardiology:   Cleared by Dr. Dion Body - Cardiology  EKG 7JUN Atrial paced  Pacemaker, not dependent    08/20/13 Echo: nl LVSF, borderline LVH, nl LA size, no sig valvular  abnormalities.  07/17/13 pacemaker check: 80% A paced, AT/AF burden 0%, < 1% since 09/24/2009.  09/21/11 nuclear stress: no ischemia  Lexiscan nuclear stress test 08/11/15: a small sized area of ischemia in the anterior wall, LVEF 75%.   Carotid artery duplex 12/16/15: Normal R carotid artery, mild dz on the left side.   Preop clearance:   Earnest Conroy, PA     Assessment:   Posthysterectomy vaginal vault prolapse, SUI     Surgical Plan:   Colpectomy, midurethral sling (TVT), cystourethroscopy    Preop antibiotics: ancef 1gm  DVT prophylaxis: scd, lovenox  Special - avoid QT prolongation meds (zofran), tele postop, continue propafenone postop    Lawanda Cousins, MD  Urogynecology Fellow      Patient seen and no change in H&P.  Procedure reviewed per consent and questions answered.  PLAN- colpectomy with enterocele repair and TVT sling, cystoscopy  Genice Rouge, M.D.

## 2016-02-07 ENCOUNTER — Inpatient Hospital Stay (HOSPITAL_BASED_OUTPATIENT_CLINIC_OR_DEPARTMENT_OTHER): Payer: Medicare Other | Admitting: Certified Registered"

## 2016-02-07 ENCOUNTER — Inpatient Hospital Stay
Admission: RE | Admit: 2016-02-07 | Discharge: 2016-02-08 | DRG: 747 | Disposition: A | Discharge status: Home / Self Care | Payer: Medicare Other | Source: Ambulatory Visit | Attending: Female Pelvic Medicine and Reconstructive Surgery | Admitting: Female Pelvic Medicine and Reconstructive Surgery

## 2016-02-07 ENCOUNTER — Encounter
Admission: RE | Disposition: A | Discharge status: Home / Self Care | Payer: Self-pay | Source: Ambulatory Visit | Attending: Female Pelvic Medicine and Reconstructive Surgery

## 2016-02-07 ENCOUNTER — Inpatient Hospital Stay: Payer: Medicare Other | Admitting: Female Pelvic Medicine and Reconstructive Surgery

## 2016-02-07 ENCOUNTER — Encounter (HOSPITAL_BASED_OUTPATIENT_CLINIC_OR_DEPARTMENT_OTHER): Payer: Self-pay

## 2016-02-07 DIAGNOSIS — E785 Hyperlipidemia, unspecified: Secondary | ICD-10-CM | POA: Diagnosis present

## 2016-02-07 DIAGNOSIS — N819 Female genital prolapse, unspecified: Secondary | ICD-10-CM | POA: Diagnosis present

## 2016-02-07 DIAGNOSIS — N993 Prolapse of vaginal vault after hysterectomy: Principal | ICD-10-CM | POA: Diagnosis present

## 2016-02-07 DIAGNOSIS — K219 Gastro-esophageal reflux disease without esophagitis: Secondary | ICD-10-CM | POA: Diagnosis present

## 2016-02-07 DIAGNOSIS — Z950 Presence of cardiac pacemaker: Secondary | ICD-10-CM

## 2016-02-07 DIAGNOSIS — I4891 Unspecified atrial fibrillation: Secondary | ICD-10-CM | POA: Diagnosis present

## 2016-02-07 DIAGNOSIS — N3642 Intrinsic sphincter deficiency (ISD): Secondary | ICD-10-CM | POA: Diagnosis present

## 2016-02-07 DIAGNOSIS — E039 Hypothyroidism, unspecified: Secondary | ICD-10-CM | POA: Diagnosis present

## 2016-02-07 DIAGNOSIS — N393 Stress incontinence (female) (male): Secondary | ICD-10-CM | POA: Diagnosis present

## 2016-02-07 HISTORY — DX: Gastro-esophageal reflux disease without esophagitis: K21.9

## 2016-02-07 SURGERY — COLPECTOMY
Anesthesia: Anesthesia General | Site: Pelvis | Wound class: Clean Contaminated

## 2016-02-07 MED ORDER — ACETAMINOPHEN 500 MG PO TABS
ORAL_TABLET | ORAL | Status: AC
Start: 2016-02-07 — End: ?
  Filled 2016-02-07: qty 2

## 2016-02-07 MED ORDER — VASOPRESSIN 20 UNIT/ML IV SOLN
INTRAVENOUS | Status: AC
Start: 2016-02-07 — End: ?
  Filled 2016-02-07: qty 1

## 2016-02-07 MED ORDER — STERILE WATER FOR IRRIGATION IR SOLN
Status: DC | PRN
Start: 2016-02-07 — End: 2016-02-07
  Administered 2016-02-07: 300 mL

## 2016-02-07 MED ORDER — PHENAZOPYRIDINE HCL 100 MG PO TABS
ORAL_TABLET | ORAL | Status: AC
Start: 2016-02-07 — End: ?
  Filled 2016-02-07: qty 2

## 2016-02-07 MED ORDER — PROCHLORPERAZINE EDISYLATE 5 MG/ML IJ SOLN
10.0000 mg | INTRAMUSCULAR | Status: DC | PRN
Start: 2016-02-07 — End: 2016-02-07
  Filled 2016-02-07 (×2): qty 2

## 2016-02-07 MED ORDER — SODIUM CHLORIDE BACTERIOSTATIC 0.9 % IJ SOLN
INTRAMUSCULAR | Status: AC
Start: 2016-02-07 — End: ?
  Filled 2016-02-07: qty 60

## 2016-02-07 MED ORDER — LIDOCAINE HCL 2 % IJ SOLN
INTRAMUSCULAR | Status: DC | PRN
Start: 2016-02-07 — End: 2016-02-07
  Administered 2016-02-07: 100 mg

## 2016-02-07 MED ORDER — HYDROCODONE-ACETAMINOPHEN 5-325 MG PO TABS
1.0000 | ORAL_TABLET | ORAL | Status: DC | PRN
Start: 2016-02-07 — End: 2016-02-07

## 2016-02-07 MED ORDER — FENTANYL CITRATE (PF) 50 MCG/ML IJ SOLN (WRAP)
INTRAMUSCULAR | Status: AC
Start: 2016-02-07 — End: ?
  Filled 2016-02-07: qty 2

## 2016-02-07 MED ORDER — FAMOTIDINE 20 MG/2ML IV SOLN
INTRAVENOUS | Status: AC
Start: 2016-02-07 — End: ?
  Filled 2016-02-07: qty 2

## 2016-02-07 MED ORDER — FENTANYL CITRATE (PF) 50 MCG/ML IJ SOLN (WRAP)
INTRAMUSCULAR | Status: DC | PRN
Start: 2016-02-07 — End: 2016-02-07
  Administered 2016-02-07 (×4): 25 ug via INTRAVENOUS
  Administered 2016-02-07: 50 ug via INTRAVENOUS

## 2016-02-07 MED ORDER — BELLADONNA ALKALOIDS-OPIUM 16.2-60 MG RE SUPP
RECTAL | Status: AC
Start: 2016-02-07 — End: ?
  Filled 2016-02-07: qty 1

## 2016-02-07 MED ORDER — ONDANSETRON HCL 4 MG/2ML IJ SOLN
4.0000 mg | Freq: Once | INTRAMUSCULAR | Status: DC | PRN
Start: 2016-02-07 — End: 2016-02-07

## 2016-02-07 MED ORDER — OXYCODONE HCL 5 MG PO TABS
10.0000 mg | ORAL_TABLET | ORAL | Status: DC | PRN
Start: 2016-02-07 — End: 2016-02-08

## 2016-02-07 MED ORDER — SODIUM CHLORIDE 0.9% BAG (IRRIGATION USE)
INTRAVENOUS | Status: DC | PRN
Start: 2016-02-07 — End: 2016-02-07
  Administered 2016-02-07: 1000 mL

## 2016-02-07 MED ORDER — BELLADONNA ALKALOIDS-OPIUM 16.2-60 MG RE SUPP
60.0000 mg | Freq: Once | RECTAL | Status: AC | PRN
Start: 2016-02-07 — End: 2016-02-07
  Administered 2016-02-07: 60 mg via RECTAL

## 2016-02-07 MED ORDER — ACETAMINOPHEN 500 MG PO TABS
1000.0000 mg | ORAL_TABLET | Freq: Once | ORAL | Status: AC | PRN
Start: 2016-02-07 — End: 2016-02-07
  Administered 2016-02-07: 1000 mg via ORAL

## 2016-02-07 MED ORDER — SODIUM CHLORIDE 0.9 % IR SOLN
Status: DC | PRN
Start: 2016-02-07 — End: 2016-02-07

## 2016-02-07 MED ORDER — ACETAMINOPHEN 500 MG PO TABS
1000.0000 mg | ORAL_TABLET | Freq: Once | ORAL | Status: AC
Start: 2016-02-07 — End: 2016-02-07
  Administered 2016-02-07: 1000 mg via ORAL

## 2016-02-07 MED ORDER — GLYCOPYRROLATE 0.2 MG/ML IJ SOLN
INTRAMUSCULAR | Status: DC | PRN
Start: 2016-02-07 — End: 2016-02-07
  Administered 2016-02-07 (×2): 0.1 mg via INTRAVENOUS

## 2016-02-07 MED ORDER — PROPOFOL 10 MG/ML IV EMUL (WRAP)
INTRAVENOUS | Status: DC | PRN
Start: 2016-02-07 — End: 2016-02-07
  Administered 2016-02-07 (×2): 20 mg via INTRAVENOUS
  Administered 2016-02-07 (×2): 30 mg via INTRAVENOUS
  Administered 2016-02-07: 100 mg via INTRAVENOUS

## 2016-02-07 MED ORDER — PHENAZOPYRIDINE HCL 200 MG PO TABS
ORAL_TABLET | ORAL | Status: AC
Start: 2016-02-07 — End: ?
  Filled 2016-02-07: qty 1

## 2016-02-07 MED ORDER — LEVOTHYROXINE SODIUM 50 MCG PO TABS
50.0000 ug | ORAL_TABLET | Freq: Every day | ORAL | Status: DC
Start: 2016-02-08 — End: 2016-02-08
  Administered 2016-02-08: 50 ug via ORAL
  Filled 2016-02-07: qty 1

## 2016-02-07 MED ORDER — PROPOFOL 10 MG/ML IV EMUL (WRAP)
INTRAVENOUS | Status: AC
Start: 2016-02-07 — End: ?
  Filled 2016-02-07: qty 20

## 2016-02-07 MED ORDER — PROPAFENONE HCL 150 MG PO TABS
150.0000 mg | ORAL_TABLET | Freq: Three times a day (TID) | ORAL | Status: DC
Start: 2016-02-07 — End: 2016-02-08
  Filled 2016-02-07 (×2): qty 1

## 2016-02-07 MED ORDER — FENTANYL CITRATE (PF) 50 MCG/ML IJ SOLN (WRAP)
50.0000 ug | INTRAMUSCULAR | Status: AC | PRN
Start: 2016-02-07 — End: 2016-02-07
  Administered 2016-02-07 (×2): 50 ug via INTRAVENOUS

## 2016-02-07 MED ORDER — FAMOTIDINE 20 MG PO TABS
20.0000 mg | ORAL_TABLET | Freq: Two times a day (BID) | ORAL | Status: DC
Start: 2016-02-07 — End: 2016-02-08
  Administered 2016-02-07 – 2016-02-08 (×2): 20 mg via ORAL
  Filled 2016-02-07 (×2): qty 1

## 2016-02-07 MED ORDER — PHENAZOPYRIDINE HCL 100 MG PO TABS
200.0000 mg | ORAL_TABLET | Freq: Once | ORAL | Status: AC
Start: 2016-02-07 — End: 2016-02-07
  Administered 2016-02-07: 200 mg via ORAL

## 2016-02-07 MED ORDER — KETOROLAC TROMETHAMINE 30 MG/ML IJ SOLN
15.0000 mg | Freq: Four times a day (QID) | INTRAMUSCULAR | Status: DC
Start: 2016-02-07 — End: 2016-02-08
  Administered 2016-02-07 – 2016-02-08 (×4): 15 mg via INTRAVENOUS
  Filled 2016-02-07 (×3): qty 1

## 2016-02-07 MED ORDER — DOCUSATE SODIUM 100 MG PO CAPS
100.0000 mg | ORAL_CAPSULE | Freq: Two times a day (BID) | ORAL | Status: DC
Start: 2016-02-07 — End: 2016-02-08
  Administered 2016-02-07 – 2016-02-08 (×2): 100 mg via ORAL
  Filled 2016-02-07 (×2): qty 1

## 2016-02-07 MED ORDER — IBUPROFEN 600 MG PO TABS
600.0000 mg | ORAL_TABLET | Freq: Once | ORAL | Status: DC | PRN
Start: 2016-02-07 — End: 2016-02-07

## 2016-02-07 MED ORDER — DEXAMETHASONE SODIUM PHOSPHATE 4 MG/ML IJ SOLN (WRAP)
INTRAMUSCULAR | Status: DC | PRN
Start: 2016-02-07 — End: 2016-02-07
  Administered 2016-02-07: 4 mg via INTRAVENOUS

## 2016-02-07 MED ORDER — SENNOSIDES-DOCUSATE SODIUM 8.6-50 MG PO TABS
2.0000 | ORAL_TABLET | Freq: Every day | ORAL | Status: DC
Start: 2016-02-07 — End: 2023-06-27

## 2016-02-07 MED ORDER — GLYCOPYRROLATE 0.2 MG/ML IJ SOLN
INTRAMUSCULAR | Status: AC
Start: 2016-02-07 — End: ?
  Filled 2016-02-07: qty 1

## 2016-02-07 MED ORDER — METOCLOPRAMIDE HCL 5 MG/ML IJ SOLN
10.0000 mg | Freq: Three times a day (TID) | INTRAMUSCULAR | Status: DC | PRN
Start: 2016-02-07 — End: 2016-02-08
  Administered 2016-02-07: 10 mg via INTRAVENOUS
  Filled 2016-02-07: qty 2

## 2016-02-07 MED ORDER — HYDROMORPHONE HCL 1 MG/ML IJ SOLN
0.2000 mg | INTRAMUSCULAR | Status: AC | PRN
Start: 2016-02-07 — End: 2016-02-07
  Administered 2016-02-07 (×4): 0.2 mg via INTRAVENOUS

## 2016-02-07 MED ORDER — KETOROLAC TROMETHAMINE 30 MG/ML IJ SOLN
INTRAMUSCULAR | Status: AC
Start: 2016-02-07 — End: ?
  Filled 2016-02-07: qty 1

## 2016-02-07 MED ORDER — EPHEDRINE SULFATE 50 MG/ML IJ/IV SOLN (WRAP)
Status: AC
Start: 2016-02-07 — End: ?
  Filled 2016-02-07: qty 1

## 2016-02-07 MED ORDER — HYDROMORPHONE HCL 1 MG/ML IJ SOLN
INTRAMUSCULAR | Status: AC
Start: 2016-02-07 — End: ?
  Filled 2016-02-07: qty 1

## 2016-02-07 MED ORDER — GABAPENTIN 300 MG PO CAPS
300.0000 mg | ORAL_CAPSULE | Freq: Once | ORAL | Status: DC
Start: 2016-02-07 — End: 2016-02-07

## 2016-02-07 MED ORDER — FAMOTIDINE 10 MG/ML IV SOLN (WRAP)
INTRAVENOUS | Status: DC | PRN
Start: 2016-02-07 — End: 2016-02-07
  Administered 2016-02-07: 20 mg via INTRAVENOUS

## 2016-02-07 MED ORDER — SODIUM CHLORIDE 0.9 % IJ SOLN
INTRAMUSCULAR | Status: AC
Start: 2016-02-07 — End: ?
  Filled 2016-02-07: qty 10

## 2016-02-07 MED ORDER — ACETAMINOPHEN 325 MG PO TABS
650.0000 mg | ORAL_TABLET | ORAL | Status: DC | PRN
Start: 2016-02-07 — End: 2016-02-08

## 2016-02-07 MED ORDER — LACTATED RINGERS IV SOLN
125.0000 mL/h | INTRAVENOUS | Status: DC
Start: 2016-02-07 — End: 2016-02-08
  Administered 2016-02-07 – 2016-02-08 (×2): 125 mL/h via INTRAVENOUS

## 2016-02-07 MED ORDER — ENOXAPARIN SODIUM 40 MG/0.4ML SC SOLN
40.0000 mg | Freq: Every day | SUBCUTANEOUS | Status: DC
Start: 2016-02-08 — End: 2016-02-08
  Administered 2016-02-08: 40 mg via SUBCUTANEOUS
  Filled 2016-02-07: qty 0.4

## 2016-02-07 MED ORDER — BUPIVACAINE HCL (PF) 0.25 % IJ SOLN
INTRAMUSCULAR | Status: AC
Start: 2016-02-07 — End: ?
  Filled 2016-02-07: qty 40

## 2016-02-07 MED ORDER — LIDOCAINE HCL 1 % IJ SOLN
INTRAMUSCULAR | Status: AC
Start: 2016-02-07 — End: ?
  Filled 2016-02-07: qty 10

## 2016-02-07 MED ORDER — CEFAZOLIN SODIUM 1 G IJ SOLR
INTRAMUSCULAR | Status: AC
Start: 2016-02-07 — End: ?
  Filled 2016-02-07: qty 1000

## 2016-02-07 MED ORDER — METOCLOPRAMIDE HCL 10 MG PO TABS
10.0000 mg | ORAL_TABLET | Freq: Three times a day (TID) | ORAL | Status: DC | PRN
Start: 2016-02-07 — End: 2016-02-08

## 2016-02-07 MED ORDER — MEPERIDINE HCL 25 MG/ML IJ SOLN
12.5000 mg | INTRAMUSCULAR | Status: DC | PRN
Start: 2016-02-07 — End: 2016-02-07

## 2016-02-07 MED ORDER — METOCLOPRAMIDE HCL 5 MG/ML IJ SOLN
5.0000 mg | INTRAMUSCULAR | Status: AC | PRN
Start: 2016-02-07 — End: 2016-02-07
  Administered 2016-02-07 (×2): 5 mg via INTRAVENOUS

## 2016-02-07 MED ORDER — PHENAZOPYRIDINE HCL 200 MG PO TABS
200.0000 mg | ORAL_TABLET | Freq: Once | ORAL | Status: AC
Start: 2016-02-07 — End: 2016-02-07
  Administered 2016-02-07: 200 mg via ORAL

## 2016-02-07 MED ORDER — BUPIVACAINE HCL 0.25 % IJ SOLN
INTRAMUSCULAR | Status: DC | PRN
Start: 2016-02-07 — End: 2016-02-07
  Administered 2016-02-07: 30 mL

## 2016-02-07 MED ORDER — VASOPRESSIN 20 UNIT/ML IV SOLN
INTRAVENOUS | Status: DC | PRN
Start: 2016-02-07 — End: 2016-02-07
  Administered 2016-02-07: 6 [IU]

## 2016-02-07 MED ORDER — LACTATED RINGERS IV SOLN
INTRAVENOUS | Status: DC
Start: 2016-02-07 — End: 2016-02-07

## 2016-02-07 MED ORDER — METOCLOPRAMIDE HCL 5 MG/ML IJ SOLN
INTRAMUSCULAR | Status: AC
Start: 2016-02-07 — End: ?
  Filled 2016-02-07: qty 2

## 2016-02-07 MED ORDER — OXYCODONE HCL 5 MG PO TABS
5.0000 mg | ORAL_TABLET | ORAL | Status: DC | PRN
Start: 2016-02-07 — End: 2016-02-08
  Administered 2016-02-07 – 2016-02-08 (×2): 5 mg via ORAL
  Filled 2016-02-07: qty 1

## 2016-02-07 MED ORDER — OXYCODONE HCL 5 MG PO TABS
ORAL_TABLET | ORAL | Status: AC
Start: 2016-02-07 — End: ?
  Filled 2016-02-07: qty 1

## 2016-02-07 MED ORDER — CEFAZOLIN SODIUM 1 G IJ SOLR
1.0000 g | Freq: Once | INTRAMUSCULAR | Status: AC
Start: 2016-02-07 — End: 2016-02-07
  Administered 2016-02-07: 1 g via INTRAVENOUS

## 2016-02-07 MED ORDER — BELLADONNA ALKALOIDS-OPIUM 16.2-60 MG RE SUPP
60.0000 mg | Freq: Two times a day (BID) | RECTAL | Status: DC | PRN
Start: 2016-02-07 — End: 2016-02-08

## 2016-02-07 MED ORDER — ENOXAPARIN SODIUM 40 MG/0.4ML SC SOLN
SUBCUTANEOUS | Status: AC
Start: 2016-02-07 — End: ?
  Filled 2016-02-07: qty 0.4

## 2016-02-07 MED ORDER — OXYCODONE HCL 5 MG PO TABS
5.0000 mg | ORAL_TABLET | ORAL | 0 refills | Status: DC | PRN
Start: 2016-02-07 — End: 2016-03-06
  Filled 2016-02-08: qty 30, 5d supply, fill #0

## 2016-02-07 MED ORDER — ENOXAPARIN SODIUM 40 MG/0.4ML SC SOLN
40.0000 mg | Freq: Once | SUBCUTANEOUS | Status: AC
Start: 2016-02-07 — End: 2016-02-07
  Administered 2016-02-07: 40 mg via SUBCUTANEOUS

## 2016-02-07 MED ORDER — DEXAMETHASONE SODIUM PHOSPHATE 20 MG/5ML IJ SOLN
INTRAMUSCULAR | Status: AC
Start: 2016-02-07 — End: ?
  Filled 2016-02-07: qty 5

## 2016-02-07 MED ORDER — EPHEDRINE SULFATE 50 MG/ML IJ SOLN
INTRAMUSCULAR | Status: DC | PRN
Start: 2016-02-07 — End: 2016-02-07
  Administered 2016-02-07 (×2): 5 mg via INTRAVENOUS

## 2016-02-07 SURGICAL SUPPLY — 51 items
BAND RUBBER STRL 3X1/8IN (Procedure Accessories) ×4 IMPLANT
BLADE S/SU RIBBACK CARB STL 15 (Blade) IMPLANT
COVER MAYO CNVRT STND 23IN PLS REINF (Drape) ×1
COVER STAND W23 IN REINFORCE PLASTIC (Drape) ×1 IMPLANT
COVER STND PLS MAYO CNVRT 23IN LF STRL (Drape) ×1
GLOVE SURG BIOGEL PF LTX SZ7.0 (Glove) ×2 IMPLANT
GLOVE SURG LTX SZ 7 (Glove) ×2 IMPLANT
GOWN SRG XL SMARTGOWN LF STRL LVL 4 (Gown)
GOWN SURGICAL XL SMARTGOWN LEVEL 4 (Gown)
GOWN SURGICAL XL SMARTGOWN LEVEL 4 BREATHABLE (Gown) IMPLANT
NEEDLE INJ SFTY 22GX1.5IN (Needles) ×4 IMPLANT
NEEDLE REG BEVEL 19GX1.5IN (Needles) ×2 IMPLANT
PACK LITHOTOMY MINOR (Pack) ×2 IMPLANT
PACK MAJOR LITHOTOMY IFH (Pack) ×2 IMPLANT
PAD ABD DERMACEA 9X5IN LF STRL 4 SL EDG (Dressing) ×1
PAD ABDOMINAL L9 IN X W5 IN 4 SEAL EDGE (Dressing) ×1 IMPLANT
PAD ELECTROSRG GRND REM W CRD (Procedure Accessories) ×2 IMPLANT
PAD SANITARY L12.25 IN X W4.25 IN HEAVY ABSORBENT MOISTURE BARRIER (Dressing) ×1 IMPLANT
PAD SNTR SLK FLF CRTY 12.25X4.25IN LF NS (Dressing) ×2
SET IRR DEHP 10 GTT/ML STRG 81IN LF STRL (Tubing) ×1
SET IRRIGATION L81 IN 10 GTT/ML STRAIGHT (Tubing) ×1
SET IRRIGATION L81 IN 10 GTT/ML STRAIGHT NA DEHP BLADDER REGULATE (Tubing) ×1 IMPLANT
SLING GYN GNCR TVT EXACT 3MM TROC SHFT (Mesh) ×1 IMPLANT
SLING GYNECOLOGICAL TROCAR SHAFT (Mesh) ×1 IMPLANT
SLING GYNECOLOGICAL TROCAR SHAFT ERGONOMIC HANDLE OD3 MM GYNECARE TVT (Mesh) ×1 IMPLANT
SOL WATER STRL 1000CC IV BAG (IV Solutions) ×2 IMPLANT
SOLUTION IRR 0.9% NACL 1000ML LF STRL (Irrigation Solutions) ×1
SOLUTION IRRIGATION 0.9% SODIUM CHLORIDE (Irrigation Solutions) ×1
SOLUTION IRRIGATION 0.9% SODIUM CHLORIDE 1000 ML PLASTIC POUR BOTTLE (Irrigation Solutions) ×1 IMPLANT
SOLUTION IV 0.9% NACL 1000ML VFLX LF PLS (IV Solutions) ×1
SOLUTION IV 0.9% SODIUM CHLORIDE PVC (IV Solutions) ×1
SOLUTION IV 0.9% SODIUM CHLORIDE PVC 1000 ML PH 5 PLASTIC CONTAINER (IV Solutions) ×1 IMPLANT
SPONGE SRG VISTEC 4X4IN LF STRL 16 PLY (Sponge) ×1
SPONGE SURGICAL L4 IN X W4 IN 16 PLY (Sponge) ×1 IMPLANT
STAPLER SKIN 35 COUNT REGULAR STAPLE (Staplers) ×1
STAPLER SKIN 35 COUNT REGULAR STAPLE CARTRIDGE ERGONOMIC HANDLE (Staplers) ×1 IMPLANT
STAPLER SKN SS PLS APPOSE LF 35 CNT REG (Staplers) ×1
SUTURE ABS 2-0 CT1 VCL 27IN BRD COAT UD (Suture) ×2
SUTURE COATED VICRYL 2-0 CT-1 L27 IN (Suture) ×2
SUTURE COATED VICRYL 2-0 CT-1 L27 IN BRAID COATED UNDYED ABSORBABLE (Suture) ×2 IMPLANT
SUTURE ETHIBOND 2-0 CT1 30IN (Suture) IMPLANT
SUTURE ETHIBOND 2-0 CT2 30 (Suture) IMPLANT
SUTURE VICRYL 0 UR6 27IN (Suture) IMPLANT
SUTURE VICRYL 2-0 CT-2 (Suture) ×4 IMPLANT
SUTURE VICRYL 2-0 CT2 8X18IN (Suture) IMPLANT
SUTURE VICRYL 3-0 SH 27IN (Suture) ×4 IMPLANT
SYRINGE 10 ML CONTROL CONCENTRIC TIP (Syringes, Needles) ×2
SYRINGE 10 ML CONTROL CONCENTRIC TIP PYROGEN FREE DEHP FREE LOK (Syringes, Needles) ×2 IMPLANT
SYRINGE MED 10ML LL LF STRL CNTRL CONC (Syringes, Needles) ×2
TOWEL STERILE REUSABLE 8PK (Procedure Accessories) ×2 IMPLANT
TRAY SSTEP CATH UMETER 16FR (Tray) ×2 IMPLANT

## 2016-02-07 NOTE — Transfer of Care (Signed)
Anesthesia Transfer of Care Note    Patient: Margaret Shea    Procedures performed: Procedure(s):  COLPECTOMY, TVT SLING  CYSTOSCOPY    Anesthesia type: General LMA    Patient location:Phase I PACU    Last vitals:   Filed Vitals:    02/07/16 1438   BP: 154/72   Pulse: 67   Temp: 36.4 C (97.6 F)   Resp: 14   SpO2: 99%       Post pain: Patient not complaining of pain, continue current therapy      Mental Status:awake    Respiratory Function: tolerating face mask    Cardiovascular: stable    Nausea/Vomiting: patient not complaining of nausea or vomiting    Hydration Status: adequate    Post assessment: no apparent anesthetic complications and no reportable events    Signed by: Onnie Graham  02/07/2016 2:39 PM

## 2016-02-07 NOTE — Op Note (Signed)
Procedure Date: 02/07/2016     Patient Type: I     SURGEON: Selinda Flavin Margaret Shea  ASSISTANT:       ASSISTANT:   Dr. Larina Bras.     PREOPERATIVE DIAGNOSIS:  Post-hysterectomy vaginal vault prolapse with cystocele, rectocele,  enterocele, stress urinary incontinence with intrinsic sphincter  deficiency.     POSTOPERATIVE DIAGNOSIS:  Post-hysterectomy vaginal vault prolapse with cystocele, rectocele,  enterocele, stress urinary incontinence with intrinsic sphincter  deficiency.     TITLE OF PROCEDURES:  Colpectomy with enterocele repair, perineorrhaphy, tension-free vaginal  tape suburethral sling, cystoscopy.     ANESTHESIA:  General endotracheal tube.     FINDINGS:  The patient was noted to be status post prior hysterectomy with loss of  apical support such that the vaginal cuff descended to within 2 to 3 cm of  the hymenal ring.  This resulted in the anterior vaginal wall prolapsing 4  cm beyond the hymenal ring secondary to both the apical as well as midline  defect.  She also had thinning of the rectovaginal septum with evidence of  an enterocele and rectocele.  She had undergone preoperative urodynamic  testing with her prolapse reduced, which did show evidence of occult stress  urinary incontinence.  She had a positive leak point pressure at 37 cm of  water in the sitting position with a subjectively full bladder and  Valsalva.  She was counseled regarding different treatment options that  were available for her prolapse.  She had been wearing a vaginal pessary,  but elected to undergo definitive treatment.  She was not sexually active,  and this was not a priority for her in her treatment plan.  She was  counseled regarding changes in vaginal anatomy that would be present  following colpectomy and that she would not be able to engage in future  vaginal sexual activity.  She understood that this was a permanent change.   Her intraoperative findings revealed the above anatomic defects.  The  intraoperative  cystoscopy at the end of the procedure was normal without  any evidence of lesions or trauma to the bladder or to the urethra and with  bilateral ureteral flow.     DESCRIPTION OF PROCEDURE:  After informed consent was obtained, the patient was taken to the operating  room, where an adequate level of anesthesia was achieved.  The patient was  then positioned, prepped and draped in the usual manner.  A Foley catheter  was inserted to drain the bladder.  We then began by grasping the anterior  vaginal wall just below the vaginal cuff.  The submucosal region in the  anterior vaginal wall was infiltrated with 1:10 dilute vasopressin  solution.  A horizontal incision was then made across the vaginal  epithelium and the anterior vaginal epithelium opened beginning at this  point and extending to approximately 1 cm from the urethral meatus.  The  vaginal epithelium was dissected off of the underlying tissue by sharp  dissection out as far laterally as possible.  We then also began the  dissection superiorly to mobilize the area of the epithelium from the  vaginal cuff and upper posterior vaginal wall off of the underlying portion  of the superior aspect of the bladder as well as enterocele sac.  Once this  tissue was adequately mobilized, we began to reduce the redundancy of the  cystocele as well as the enterocele using several running sutures of 2-0  Vicryl in a staggered multilayer fashion.  We also placed additional  interrupted sutures of 2-0 Vicryl at the vaginal apex to further reinforce  closure of the enterocele.  Once the area had been adequately reinforced,  we turned to the lower posterior vaginal wall.     Two Allis clamps were used to grasp the posterior fourchette tissue and the  submucosal region was infiltrated with 1:10 dilute vasopressin solution.  A  horizontal incision was then made and the posterior vaginal epithelium was  opened beginning at this point and extending to the prior dissection field.    The vaginal epithelium was then dissected off of the underlying tissue by  sharp dissection out as far laterally as possible.  The dissection was done  with the surgeon's finger in the rectum to delineate the correct tissue  planes.  With the surgeon's finger remaining in the rectum, the redundant  rectovaginal tissue was then plicated using several running layers of 2-0  Vicryl.  We also placed some figure-of-eight sutures of 2-0 Vicryl in a  vertical fashion to rebuild the attenuated tissue as palpated by the  surgeon during rectal examination.  This plication began to further narrow  the vaginal caliber.  Once that had been nearly completed, we then placed  anterior to posterior figure-of-eight sutures of 2-0 Vicryl near the upper  portion of the vagina and brought this down to just above the level of the  urethrovesical junction to obliterate the upper vagina and reduce the  vaginal length.  Once this had been accomplished, we turned to placement of  the TVT.     The suprapubic incision sites were then marked out and infiltrated with  Marcaine 0.25%.  Small incisions were then made.  The periurethral tissue  had been adequately mobilized out to the descending pubic ramus with the  previous dissection.  The bladder was ensured to be emptied with the  previously placed Foley catheter and the stylette was placed into the  catheter.  The bladder was then deviated to the contralateral side and the  right TVT trocar was inserted along the posterior pubic ramus, staying  medial to the pubic tubercle and lateral to the bladder and urethra.  The  bladder was then deviated to the contralateral side and the left TVT trocar  was inserted in a similar fashion.  With the 2 sheaths remaining in the  retropubic space, the Foley catheter was removed and the 70-degree  cystoscope was inserted.  The bladder was then filled and inspected and  there was no evidence of trauma or lesions to the bladder or the urethra  and there was  bilateral ureteral flow visualized.  At this point, we then  removed the scope with approximately 250 mL in the patient's bladder.   Suprapubic pressure did not result in any loss of urine from the urethral  meatus.  We then placed the Foley catheter to drain the bladder.  The TVT  was then elevated to the point where it almost came in contact with the  suburethral area with the anatomy now restored to its normal position;  however, there still was both a palpable as well as a visible gap in space  between the urethra and the TVT.  The graft was then stabilized in this  position with the Kelly clamp without clamping of the mesh itself.  The  outer sheaths were removed, each individually checking for tensioning after  each removal.  The excess TVT was then trimmed below the skin surface and  the skin incisions closed.  There was no bleeding from the retropubic  tunnel.     At this point, we then trimmed the excess vaginal epithelium.  We began the  vaginal epithelial closure with a running suture of 2-0 Vicryl.  We did  place some additional deep sutures of 2-0 Vicryl in the perineal body to  complete the perineorrhaphy and the completion of the narrowing of  introitus.  The perineal skin was closed with 3-0 Vicryl suture.  Marcaine  0.25% was injected into the perineal body.  The patient was then awakened  and taken to the PR in good condition.     ESTIMATED BLOOD LOSS:  Approximately 125 mL.     SPECIMENS TO PATHOLOGY:  None.     COMPLICATIONS:  None.           D:  02/07/2016 16:01 PM by Dr. Solmon Ice. Margaret Lints, Margaret Shea [id number])  T:  02/07/2016 21:45 PM by       Everlean Cherry: [id number]) (Doc ID: [id number])

## 2016-02-07 NOTE — Discharge Instructions (Signed)
[facility]     Post-operative Instructions  Reconstructive Pelvic Surgery    Nicolette S. Horbach, M.D.  Jeffrey A. Welgoss, M.D.  Walter von Pechmann, M.D.        The postoperative healing process is very important to the long-term outcome of your surgery.  Thus, these instructions are important to follow.  Your doctor will be seeing you in the office following your surgery .  If you are healing at a more rapid rate than the average, your activity level may be advanced as appropriate.  Remember that healing takes time. At 6 weeks you will be about 50% healed, at 12 weeks about 80% healed, and it will take up to 6 months to complete healing. If there are specific questions you have regarding different activities please make a list of your questions and address them with your doctor during your post-operative  appointments.        ACTIVITIES.     LIFTING:  No lifting over 5-10 lbs for the first 6 weeks and over 20lbs for weeks 6-12. If you had surgery for pelvic organ prolapse, you should avoid heavy lifting long term as much as possible.    WALKING:  For the first few days walking should be at a minimum.  You may walk as much as you feel comfortable with.  However, some pressure or achiness during or after walking is expected after surgery. It is important to increase your activity level slowly to allow for plenty of rest when you are not up on your feet.  It is alright to leave the house, but if you go out to the movies, a restaurant or a friend's house, try to limit your walking to what you can do comfortably. .  If you feel lower abdominal or vaginal cramping or pain, it may be a sign that you are overdoing your activities.     STAIRS:  It is okay to climb stairs when you return home after surgery but stair climbing should be kept at a minimum initially.  For the first few days it should only be 1-2 times per day.  After that you may go up and down the stairs 3-5 times per day and  gradually increase as tolerated.  Try to consolidate your trips!    DRIVING:  Make sure you are off narcotic pain medicine before you drive. You may ride as a passenger as soon as you feel up to it. If you only had a TVT procedure, you may drive after 5-7-days. If you had surgery done only  through the vagina and / or by laparoscopy, you may drive after two  weeks.  If you have a procedure that involved an open incision in the abdomen, you should not drive until four weeks after surgery. When you begin driving, start with limited trips such as to a doctor's office, the bank, pick up or drop off children, or go to the grocery store for a small bag of groceries.    HOUSEWORK:  No heavy housework for 4-6 weeks except for light cooking, minimal straightening up, light vacuuming or folding laundry.  No scrubbing, mopping or moving furniture.  No gardening or outside yard work. You will have to arrange for someone else to do the shopping and your errands for the first 3-4 weeks.   VAGINAL: Nothing in the vagina for 8 weeks.  This includes tampons and/or sexual intercourse. Although at times you may have a vaginal discharge that may   even have a strong odor, you cannot douche.  The discharge and the odor will pass as the stitches inside the vagina dissolve and you complete the healing process.  You can try to limit the odor by mixing 2 tbsp of baking soda with 1 quart of warm water and pouring it gently over the external genital area 2-3 times per week.  EXERCISE:  You should avoid exercise other than walking for the first six weeks unless approved by your surgeon.. After 4-6 weeks you can begin to walk, swim, or use an elliptical or treadmill on slow setting. You may lift up to 5 lbs weights. No situps/crunches, weight machines, aerobic exercise, tennis or golf for now.  No stomach crunches or kegel exercises. Once you have completed your post-op recuperation, you will be instructed on how to resume full exerciseYou can  discuss returning to your exercise regimen when you see your surgeon at your 6 week postoperative visit.    BATHING: Showers prior to discharge from the hospital.  If you have a catheter in place at the time of discharge, it is okay for the catheter to get wet when bathing but otherwise try to keep the catheter site as dry as possible.  You should not swim, take a bath or use a hot tub or Jacuzzi until 6 weeks after surgery.    DIET    1. Once you are discharged, you will be eating regular food.  You should try to avoid fried or spicy food for the first 1-2 weeks as your bowels are returning to normal.     It is important to prevent constipation so that you can avoid minimize bearing down to have a bowel movement.  Bearing down may stress the suture lines.  Some suggestions to limit constipation are to adjust your diet, add fiber with adequate fluids (6-8 glasses of water per day), add flax seed, use 5-6 prunes or prune juice with hot tea, or use Smooth Move tea (can be purchased at [employer] or other naturalistic health stores).  A stool softener like Colace (docusate sodium or generic equivalent) should be taken once to twice a day to prevent constipation. Narcotic pain medications such as (Percocet, Vicodin, or Dilaudid)are constipating. If you are using narcotic pain medication, you will probably need to take additional Colace.  If your bowel movements become too loose, decrease your stool softeners.     If you become constipated, occasional use of a gentle laxative like Milk of Magnesia is permitted.  Avoid repetitive or strong laxatives.  If you are still having problems with constipation, you may try using Dulcolax suppositories and you should increase the amount of daily stool softeners you use. For bloating, you may use Gas-X.     FOR PAIN  You will be given a prescription for pain medications which you may need to use on occasion.  If you are only experiencing mild discomfort or aching due to activity,  try to use over the counter medicines such as acetaminophen (Tylenol), or Ibuprofen (Motrin / Advil), or naproxen (Aleve) (Advil 3-4 pills, 3 times a day).  Avoid aspirin as it may cause you to experience bruising or bleeding due to its effect of your blood clotting factors. You may also use a heating pad on low setting for abdominal discomfort or an ice pack on the vaginal area for pain or aching sensation. VICODIN AND PERCOCET CONTAIN ACETAMINOPHEN - YOU SHOULD NOT TAKE ACETAMINOPHEN AND VICODIN OR PERCOCET AT THE SAME TIME.!   You should not take more than 4,000 mg of acetaminophen in 24 hours.    CATHETER CARE  You may be discharged with a catheter if you are unable to urinate prior to discharge.  If you go home with a catheter, please call the office (call on the day orf discharge or the day after). to schedule an appointment to remove the catheter in the office.       WHAT TO EXPECT     Following surgery patients may experience a variety of symptoms:    1. Vaginal spotting or bleeding (less than a menstrual period) is expected to happen intermittentlyfor up to 6 weeks after surgery. Most patients will notice more vaginal discharge than usual Once the bleeding stops you may have vaginal discharge with a bad odor for up to 6 weeks as stitches dissolve.  If necessary, you can try to limit the odor by mixing 2 tablespoons of baking soda with 1 quart of warm water and pouring it gently over the external genital area. Please only do this 2-3 times per week.    2. "Good days and bad days" Although you will feel stronger each week, you may notice variations from day to day in your level of energy, fatigue or achiness. Listen to your body and don't overdo it. Your body will take up to 6 months to complete healing. During that time your energy level and stamina may still be affected. Take naps if needed. Get off your feet.    3.  Changes in sleep patterns:  During the first few weeks you may notice trouble sleeping. This  may be related to less activity and napping during the day. If you are having significant problems you can try Tylenol PM or Benadryl 25-50mg before bed to aid sleeping.    4.  You may notice lower abdominal or vaginal pressure or cramping if you are on your feet too long. (This is especially true if you had surgery for prolapse). As you are on your feet more, the muscles of your pelvis are required to work harder. They may become fatigued and cause a pressure feeling. To avoid this you can wear an abdominal support garment while awake and you should break up your activities through out the day resting for 20-30 minutes periodically. You may also use an anti-inflammatory like Advil or Aleve, or a heating pad to help relieve these symptoms.    FOLLOW-UP    If you didn't make a post-op appointment, you will need to call the appointment line [phone] option [phone] to make a follow-up appointment for 6 weeks from the date of surgery.  Please call for the appointment as soon as you get home from the hospital since the schedule may fill up quickly and you may have difficulty getting an appointment at the last minute.      You should notify the office about any of the following symptoms or signs that can indicate a problem that requires earlier attention.    Pain that is not controlled by your pain medicine.  Fever over 100.5 degrees.  Bleeding or drainage from your incision that soaks a gauze or wash cloth.  Bleeding from your vagina that is more than a menstrual period or soaks 1 pad every 1-2 hrs.  Severe constipation that does not respond to the measures described above.  Symptoms of a bladder infection (frequency, burning, trouble with bladder control) or problems with voiding or your catheter as discussed in the instructions on catheter   care

## 2016-02-07 NOTE — PACU (Signed)
Pt transported on monitor to [facility], RN received pt and placed on telemetry. RN aware of contraindication for QT-prolonging meds

## 2016-02-07 NOTE — Anesthesia Postprocedure Evaluation (Signed)
Anesthesia Post Evaluation    Patient: Margaret Shea    Procedure(s):  COLPECTOMY, TVT SLING  CYSTOSCOPY    Anesthesia type: general    Last Vitals:   Filed Vitals:    02/07/16 1540   BP: 145/67   Pulse: 61   Temp:    Resp: 12   SpO2: 99%       Patient Location: Phase I PACU      Post Pain: Patient not complaining of pain, continue current therapy    Mental Status: awake    Respiratory Function: tolerating room air    Cardiovascular: stable    Nausea/Vomiting: patient not complaining of nausea or vomiting    Hydration Status: adequate    Post Assessment: no apparent anesthetic complications          Anesthesia Qualified Clinical Data Registry    Central Line      CVC insertion : NO                                               Perioperative temperature management      General/neuraxial anesthesia > or = 60 minutes (excluding CABG) : YES              > Use of intraoperative active warming : YES              > Temperature > or = 36 degrees Centigrade (96.8 degrees Farenheit) during time span from 30 minutes before up to 15 minutes after anesthesia end time : YES      Administration of antibiotic prophylaxis      Age > or = 18, with IV access, with surgical procedure for which antibiotic prophylaxis indicated, and not on chronic antibiotics : YES              > Prophylactic antibiotics within 1 hour of incision (or fluroroquinolone/vancomycin within 2 hours of incision) : YES    Medication Administration      Ordering or administration of drug inconsistent with intended drug, dose, delivery or timing : NO      Dental loss/damage      Dental injury with administration of anesthesia : NO      Difficult intubation due to unrecognized difficult airway        Elective airway procedure including but not limited to: tracheostomy, fiberoptic bronchoscopy, rigid bronchoscopy; jet ventilation; or elective use of a device to facilitate airway management such as a Glidescope : NO                > Unanticipated difficult intubation  post pre-evaluation : NO      Aspiration of gastric contents        Aspiration of gastric contents : NO                    Surgical fire        Procedure requiring electrocautery/laser : YES                > Ignition/burning in invasive procedure location : NO      Immediate perioperative cardiac arrest        Cardiac arrest in OR or PACU : NO                    Unplanned hospital admission  Unplanned hospital admission for initially intended outpatient anesthesia service : NO      Unplanned ICU admission        Unplanned ICU admission related to anesthesia occurring within 24 hours of induction or start of MAC : NO      Surgical case cancellation        Cancellation of procedure after care already started by anesthesia care team : NO      Post-anesthesia transfer of care checklist/protocol to PACU        Transfer from OR to PACU upon case conclusion : YES              > Use of PACU transfer checklist/protocol : YES     (Includes the key elements of: patient identification, responsible practitioner identification (PACU nurse or advanced practitioner), discussion of pertinent history and procedure course, intraoperative anesthetic management, post-procedure plans, acknowledgement/questions)    Post-anesthesia transfer of care checklist/protocol to ICU        Transfer from OR to ICU upon case conclusion : NO                    Post-operative nausea/vomiting risk protocol        Post-operative nausea/vomiting risk protocol : YES  Patient > or = 18 with care initiated by anesthesia team that has a risk factor screen for post-op nausea/vomiting (Includes female, hx PONV, or motion sickness, non-smoker, intended opioid administration for post-op analgesia.)    Anaphylaxis        Anaphylaxis during anesthesia services : NO    (Inclusive of any suspected transfusion reaction in association with blood-bank confirmed blood product incompatibility)              Teryl Lucy, 02/07/2016 3:47 PM

## 2016-02-07 NOTE — Brief Op Note (Signed)
BRIEF OP NOTE    Date Time: 02/07/2016 2:19 PM    Patient Name:   Margaret Shea    Date of Operation:   02/07/2016    Providers Performing:   Surgeon(s):  Selinda Flavin, MD  Cammie Mcgee, MD    Assistant (s):  Larina Bras, [place]    Operative Procedure:   1. Colpectomy  2. Retropubic midurethral sling  3. 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 Item Serial No. Manufacturer Lot No. LRB No. Used Action   SYSTEM RETROPUBIC TVT EXACT - [id number] Mesh SYSTEM RETROPUBIC TVT EXACT   Laural Benes  [id number] N/A 1 Implanted         Drains:   Foley    Specimens:   None      Findings:   Normal appearing bladder/urethra without evidence of foreign body or injury. Bilateral ureteral jets appreciated. Increased vasculature at left ureteral orifice. Normal rectal exam.    Complications:   None      Condition:   Stable to recovery.      Signed by:     Lawanda Cousins, MD

## 2016-02-07 NOTE — Anesthesia Preprocedure Evaluation (Signed)
Anesthesia Evaluation    AIRWAY    Mallampati: II    TM distance: >3 FB  Neck ROM: full  Mouth Opening:full   CARDIOVASCULAR           DENTAL         PULMONARY         OTHER FINDINGS                      Anesthesia Plan    ASA 3     general

## 2016-02-08 ENCOUNTER — Other Ambulatory Visit: Payer: Self-pay

## 2016-02-08 LAB — HEMOGLOBIN AND HEMATOCRIT, BLOOD
Hematocrit: 28.7 % — ABNORMAL LOW (ref 37.0–47.0)
Hgb: 9.6 g/dL — ABNORMAL LOW (ref 12.0–16.0)

## 2016-02-08 MED ORDER — BENZOCAINE-MENTHOL 15-3.6 MG MT LOZG
1.0000 | LOZENGE | OROMUCOSAL | Status: DC | PRN
Start: 2016-02-08 — End: 2016-02-08
  Administered 2016-02-08: 1 via BUCCAL
  Filled 2016-02-08: qty 1

## 2016-02-08 NOTE — Plan of Care (Addendum)
Problem: Safety  Goal: Patient will be free from injury during hospitalization  Outcome: Progressing  Patient alert and oriented. Acclimated to surroundings      Intervention: Hourly rounding.  Purposeful rounding to ensure safety and comfort. Patient encouraged to call if assistance is needed    Fill/pull foley through foley. Patient voided >253mL following foley removal  Pain well-controlled with current regimen  Continue telemetry  Ambulate in hallway  Wean off O2  Prn reglan for nausea

## 2016-02-08 NOTE — PT Eval Note (Signed)
[facility]   Physical Therapy Evaluation   Patient: Margaret Shea    MRN#: [id number]   Unit: [facility] TOWER 8 EAST  Bed: F822/F822.01    Discharge Recommendations:   Discharge Recommendation: Home with supervision, Home with home health PT pt declining HHPT  DME Recommendation: DME Recommended for Discharge: Front wheel walker spouse reported they have access to a RW      Assessment:   Margaret Shea is a 82 y.o. female admitted 02/07/2016.  Pt presents with the following impairment. Pt s/p Colpectomy with enterocele repair, perineorrhaphy, tension-free vaginal tape suburethral sling, cystoscopy on 6/12. Pt has not amb since then and is feeling weak. Pt kept grabbing on to bars and furniture for support. Pt more steady w/ RW. Pt also required seated rest break during gait d/t fatigue. Dtr and spouse present and said they can assist pt at home at discharge. Pt refused HHPT so pt instructed w/ increasing activity level at home to improve over all endurance. Pt to be seen for skilled PT services to improve over all gait, balance and activity tolerance if d/c plan today unsuccessful.      Impairments: Assessment: Gait impairment;Decreased balance;Decreased endurance/activity tolerance.    Therapy Diagnosis: gait impairment    Rehabilitation Potential: good    Treatment Activities: eval performed, gait training  Educated the patient to role of physical therapy, plan of care, goals of therapy and safety with mobility and ADLs, energy conservation techniques.    Plan:   Treatment/Interventions: Investment banker, operational, Stair training, Neuromuscular re-education, Endurance training    PT Frequency: 2-3x/wk    Risks/Benefits/POC Discussed with Pt/Family: With patient       Precautions and Contraindications:  Falls    Consult received for Margaret Shea for PT Evaluation and Treatment.  Patient's medical condition is appropriate for Physical therapy intervention at this time.    Medical Diagnosis: Prolapse  of vaginal vault after hysterectomy [N99.3]  Prolapse of female pelvic organs [N81.9]      History of Present Illness:   Margaret Shea is a 82 y.o. female admitted on 02/07/2016 with "C/o POP and SUI.  She is not currently sexually active and is interested in proceeding with surgery for her POP; she wants to most effective and simplest surgery that can correct the problem. "per H&P        Past Medical/Surgical History:  Afib, V tach, pacemaker, HLD, hypothyroidism    X-Rays/Tests/Labs:  No Imaging Studies performed since last session that is pertinent at this point  Social History:   Prior Level of Function:  Baseline Activity Level:amb w/o AD, indep w/ care  DME Currently at Home: [place], [facility] (has access to RW)    Home Arrangement:  Living Arrangement: spouse and dtr  Type of Home: [facility]  Home Layout: 1 level  Home Living Notes/Comments: 4 steps w/ B HR to get in    Subjective:   Patient is agreeable to participation in the therapy session. Nursing clears patient for therapy.     Pt Goal: "walk better"    Pain:  Denies pain    Objective:   Patient is in bed with telemetry in place.    Musculoskeletal Examination:  R LE ROM:WFL  L LE ROM:WFL  R LE Strength:4+/5  L LE Strength:4+/5    Functional Mobility:  Bed Mobility   Supine to Sit:CGA HOB slightly raised  Scooting to EOB:SBA  Sit to Stand:SBA  Stand to Sit: SBA  Ambulation:  Assist:SBA (CGA/min A w/o AD)  Distance:80' 70'  Assistive Device:RW  Pattern: slow, decreased cadence, decreased step length  Number of Stairs: folded mats 4x while holding on to counter for support  Stairs Assist:CGA    Balance:  Standing Static:good w/ RW (fair+ w/o AD)  Standing Dynamic:good w/ RW( fair w/o AD)    Participation:good  Endurance:fair+    Patient left with call bell within reach, all needs met,  SCD: off  Fall mat: in place  Bed alarm: n/a  Chair alarm: on  and all questions answered. RN notified of session outcome and patient response.       Goals:   Goals  Goal Formulation:  With patient  Time for Goal Acheivement: 3 visits  Goals: Select goal  Pt Will Ambulate: > 200 feet, to maximize functional mobility and independence, with supervision (LRAD)  Pt Will Go Up / Down Stairs: 3-5 stairs, with stand by assist, to maximize functional mobility and independence, With rail     [employer], PT   Pager ID# [phone] 02/08/2016, 4:51 PM    Time of treatment:   PT Received On: 02/08/16  Start Time: 1630  Stop Time: 1700  Time Calculation (min): 30 min

## 2016-02-08 NOTE — Plan of Care (Signed)
Problem: Safety  Goal: Patient will be free from injury during hospitalization  Outcome: Progressing  Safety measures taken: Call bell provided and reviewed on how to use it. Placed bed in lowest position and wheels locked. Floor mat in place. Tubes and lines secured to free mobility. Assistance provided when out of bed. Fall prevention guide provided and reviewed importance of safety.             Comments:   Patient is alert and oriented x 4, feeling dizzy, she refused to take rhythmo as it gives her more dizzyness  Analgesic given as prescribed.  Foley catheter in place with cranberry colored output  Patient vomited, given metoclopramide IV as ordered by MD  Continued to be monitored.

## 2016-02-08 NOTE — Final Progress Note (DC Note for stay less than 48 (Signed)
Final Progress Note POD#1    S: Pt doing well. Pain controlled with PO medication. Reports N&V yesterday and has not been ambulated.   O:   Filed Vitals:    02/07/16 1850 02/07/16 1920 02/08/16 0028 02/08/16 0413   BP:  105/48 118/52 101/44   Pulse: 65 60 70 66   Temp:  95.4 F (35.2 C) 95.5 F (35.3 C) 96.7 F (35.9 C)   TempSrc:  Oral Oral Oral   Resp: 16 16 16 16    Height:       Weight:       SpO2: 97% 97% 98% 98%         Intake/Output Summary (Last 24 hours) at 02/08/16 1610  Last data filed at 02/08/16 0600   Gross per 24 hour   Intake   1700 ml   Output   1702 ml   Net     -2 ml       Gen: NAD   CV: S1/S2 present, no m/r/g  Pulm: CTAB  Abd: +BS, NT, ND  Pelvic: no vaginal packing, foley with clear urine, no bleeding   Ext: NT, no edema    Results     Procedure Component Value Units Date/Time    Hemoglobin and hematocrit, blood [[id number]]  (Abnormal) Collected:  02/08/16 0420    Specimen Information:  Blood Updated:  02/08/16 0450     Hgb 9.6 (L) g/dL      Hematocrit 11.9 (L) %     Narrative:      POD 1          A/P: 83yo s/p colpectomy/TVT/cytso  , POD#1, doing well.   -Hemodynamically stable  - ADAT   -on NC O2 2 lit over night, will wean off today   -Pain well controlled  - H/h 9.6/28.7  -Fill and pull this AM, monitor void  -Plan to D/C with 2wk follow up in office  - will resume ASA in 7 days     [place]    Urogyn Fellow Note:    Agree with above resident assessment and plan.  Pt seen and examined. Pt is doing well POD # 1.  Plan fill & pull voiding trial today.  Discharge home today in stable condition with normal VS; follow-up with Dr. Rosamaria Lints as scheduled.  Surgical procedure reviewed.  All questions answered.  Postoperative instructions reviewed.      Lawanda Cousins, MD  Urogynecology Fellow

## 2016-02-08 NOTE — Progress Note - Problem Oriented Charting Notewrit (Signed)
SW visited patient in room/discharge order written.  She declined HH PT and said that she will borrow a rolling walker from her sister.    Quincy Prisco P. Ferd Glassing, MSW  [facility]   Case Management  [phone]

## 2016-02-08 NOTE — UM Notes (Signed)
Repair of Pelvic Organ Prolapse - Clinical Indications for Procedure Met     Admit to inpatient on 02/07/16 at 1018    82 y.o. female C/o POP and SUI. Currently sexually active and is interested in proceeding with surgery for her POP; she wants to most effective and simplest surgery that can correct the problem. H/o  Afib, hypothyroid, HLD, vertigo, pacer, GERD.    PREOPERATIVE DIAGNOSIS:  Post-hysterectomy vaginal vault prolapse with cystocele, rectocele, enterocele, stress urinary incontinence with intrinsic sphincter deficiency.      Care Day#1, 02/07/16  LOC: Inpatient floor    POD#0 s/p Colpectomy with enterocele repair, perineorrhaphy, tension-free vaginal  tape suburethral sling, cystoscopy.  -Foley.    Findings:   Normal appearing bladder/urethra without evidence of foreign body or injury. Bilateral ureteral jets appreciated. Increased vasculature at left ureteral orifice. Normal rectal exam           Pre-op given ancef 1g IV x 1.    Reglan 5mg  IV prn x 2 and 10mg  IV x 2, B&O supp 60mg  po x 1, oxycodone 5mg  po prn x 1.    Care Day#2, 02/08/16  LOC: Inpatient floor    BP 113/50 mmHg  Pulse 61  Temp(Src) 96.6 F (35.9 C) (Oral)  Resp 16  Ht 1.638 m (5' 4.5")  Wt 64.411 kg (142 lb)  BMI 24.01 kg/m2  SpO2 95% on RA.    H&H 9.6/28.7    Scheduled Meds:  Current Facility-Administered Medications   Medication Dose Route Frequency   . docusate sodium  100 mg Oral Q12H SCH   . enoxaparin  40 mg Subcutaneous Daily   . famotidine  20 mg Oral Q12H SCH   . ketorolac  15 mg Intravenous Q6H   . levothyroxine  50 mcg Oral Daily at 0600   . propafenone  150 mg Oral Q8H SCH     POD#1 s/p colpectomy/TVT/cytso  -Hemodynamically stable  - ADAT   -on NC O2 2 lit over night, will wean off today   -Pain well controlled  - H/h 9.6/28.7  -Fill and pull foley this AM, monitor void  -Plan to D/C with 2wk follow up in office  - will resume ASA in 7 days       Lucia Bitter RN, BSN  Utilization Review  [facility]  Cell# [phone]   Spectra-link# [phone]   Fax# [phone].

## 2016-02-11 NOTE — Pre-Procedure Instructions (Signed)
   Pt called [phone] after surgery 02/07/16 with vaginal irritation.  Referred to Dr. Carleene Overlie office.

## 2016-02-14 ENCOUNTER — Ambulatory Visit (INDEPENDENT_AMBULATORY_CARE_PROVIDER_SITE_OTHER): Payer: Medicare Other | Admitting: Cardiovascular Disease

## 2016-02-16 ENCOUNTER — Ambulatory Visit (INDEPENDENT_AMBULATORY_CARE_PROVIDER_SITE_OTHER): Payer: Medicare Other | Admitting: Cardiovascular Disease

## 2016-03-06 ENCOUNTER — Ambulatory Visit (INDEPENDENT_AMBULATORY_CARE_PROVIDER_SITE_OTHER): Payer: Medicare Other | Admitting: Cardiovascular Disease

## 2016-03-06 ENCOUNTER — Encounter (INDEPENDENT_AMBULATORY_CARE_PROVIDER_SITE_OTHER): Payer: Self-pay | Admitting: Cardiovascular Disease

## 2016-03-06 VITALS — BP 121/65 | HR 79 | Ht 64.0 in | Wt 142.0 lb

## 2016-03-06 DIAGNOSIS — R42 Dizziness and giddiness: Secondary | ICD-10-CM

## 2016-03-06 DIAGNOSIS — I480 Paroxysmal atrial fibrillation: Secondary | ICD-10-CM

## 2016-03-06 DIAGNOSIS — R5383 Other fatigue: Secondary | ICD-10-CM

## 2016-03-06 MED ORDER — METOPROLOL TARTRATE 25 MG PO TABS
12.5000 mg | ORAL_TABLET | Freq: Two times a day (BID) | ORAL | Status: DC
Start: 2016-03-06 — End: 2016-03-31

## 2016-03-06 NOTE — Progress Notes (Signed)
[facility]    Chief Complaint   Patient presents with   . Palpitations     1 month follow up. Pt denies any cardiac symptoms at this time.       Assessment and Plan   1. Fatigue/ dizziness, and periodic DOE  Propafenone- dizzy   -Consider multifactorial including pafib with RVR, neurological problem- vertigo, or ENT problem.   - Referred to Neurology for further evaluation.   - Holding Atorvastatin did not help with the symptoms, consider restarting the med.   - stopping Protonix helped with dizziness.     2. Paroxysmal atrial fibrillation  CHADS2-VasC score 2. CHADS2 score 1  Flecainide- Tinnitus.   Propafenone- dizzy  Multaq- high copay   Pt's insurance does not cover Eliquis or Xarelto. Pt refused AC with clear understanding of the potential risk of stroke.   Pt reports fatigue, intermittent dizziness and DOE, PPM check showed recurrent pAfib.   Symptom improved; today in atrial paced rhythm, on ECASA 325 mg po qd.   - Add Metoprolol tartrate 12.5 mg po bid.     3. Pacemaker  07/17/13 pacemaker check: 80% A paced, AT/AF burden 0%, < 1% since 09/24/2009.  12/10/15 PPM check : A paced rhythm, pAfib with RVR, lasting up to 53 minutes.   Functioning well.   - follow in pacemaker clinic    4. Chest pain  Consider due to GERD/esophageal spasm; +/- coronary artery spasm/ myocardial bridge.   Admission on 08/16/15 in [facility], s/p LHC, started on Protonix and Imdur.   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   Asymptomatic  - Continue Pepcid, Maalox PRN   - Follow GERD/ esophageal spasm with Dr. Ramiro Harvest.   - Encourage exercising.     6. Hyperlipidemia  Used to be on Atorvastatin 40 mg po qd, tolerated well,  Atorvastatin 60 mg po qhs- fatigue, weakness, and muscle pain.   Pt does not have CAD, at her age, no indication for aggressive lipid control. Goal LDL < 130 mg/dl.   - Consider a  contributing factor for fatigue.   - Holding Atorvastatin, currently no obvious effect on fatigue. Advise not to start Crestor at this time.     7. Fatigue, unspecified type  02/02/14 nuclear stress test: LVEF 72%, no ischemia  Consider multifactorial; improved after taking Melatonin and exercising   - Encourage continuing  exercising.     8. Hypersomnia/ insomnia  frequently waking up during the night,now improved after taking Melatonin.    - Continue Melatonin    I have spent 25 minutes with the pt, 50% time for counseling and education.     Diagnostic Studies   08/20/13 Echo: nl LVSF, borderline LVH, nl LA size, no sig valvular abnormalities.  07/17/13 pacemaker check: 80% A paced, AT/AF burden 0%, < 1% since 09/24/2009.  09/21/11 nuclear stress: no ischemia  Lexiscan nuclear stress test 08/11/15: a small sized area of  ischemia in the anterior wall, LVEF 75%.   Carotid artery duplex 12/16/15:  Normal R carotid artery, mild dz on the left side.   History of Present Illness   82 y/o female with a PMH of hypthyrodism, pAfib, s/p PPM,  insomnia, prolapsed uterus s/p surgery 02/14/16 , and recent ER visit for CP on 08/07/15.     Pt underwent Colpectomy on 02/07/16, c/b hematoma, with Hg down to 8.4, now on iron  supplement. However,  She does feel better and the pelvic discomfort has sig improved. She still feels tired, with occasional palpitations. She reports feeling dizzy after taking Propafenone and self-discontinued the med. She still has occasional dizziness.  No CP or SOB.     Past Medical History     Past Medical History   Diagnosis Date   . Atrial fibrillation    . Ventricular tachycardia    . Hypothyroidism    . Atrial tachycardia    . Hyperlipidemia    . Prolapse of uterus    . Vertigo    . Left arm pain    . SOB (shortness of breath)    . Chest pain    . Pacemaker    . Arrhythmia      Bradycardia   . Constipation    . PNA (pneumonia) 2016     hospitalized over night for pneumonia   . Urinary tract infection  12/2015     on med, no symptoms presently   . Gastroesophageal reflux disease      on med       Past Surgical History     Past Surgical History   Procedure Laterality Date   . Back surgery  1980   . Mohs surgery     . Insert / replace / remove pacemaker  October 2011   . Dental surgery       Root canal   . Cardiac catheterization  08/16/2015   . Hysterectomy  2004   . Adenoidectomy       age7   . Tonsillectomy       age 55   . Colpectomy N/A 02/07/2016     Procedure: COLPECTOMY, TVT SLING;  Surgeon: Selinda Flavin, MD;  Location: [facility] OR;  Service: Gynecology Urology;  Laterality: N/A;   . Cystoscopy N/A 02/07/2016     Procedure: CYSTOSCOPY;  Surgeon: Selinda Flavin, MD;  Location: [facility] OR;  Service: Gynecology Urology;  Laterality: N/A;       Social History     Social History     Social History   . Marital Status: Married     Spouse Name: N/A   . Number of Children: N/A   . Years of Education: N/A     Occupational History   . Not on file.     Social History Main Topics   . Smoking status: Never Smoker    . Smokeless tobacco: Not on file   . Alcohol Use: 0.6 oz/week     1 Glasses of wine per week      Comment: Glass of wine occasionally   . Drug Use: No   . Sexual Activity: Not on file     Other Topics Concern   . Not on file     Social History Narrative       Family History     Family History   Problem Relation Age of Onset   . Myocardial Infarction Father    . Coronary artery disease Father    . Hyperlipidemia Father    . Hyperlipidemia Sister    . Hypertension Neg Hx        Allergies     Allergies   Allergen Reactions   . Flecainide      Tinnitus,    . Propafenone      dizziness       Medications     Current Outpatient Prescriptions   Medication Sig Dispense Refill   .  aspirin EC 325 MG EC tablet Take 1 tablet (325 mg total) by mouth daily. 90 tablet 3   . Cholecalciferol (VITAMIN D PO) Take by mouth daily.     Marland Kitchen CRANBERRY PO Take by mouth.     . famotidine (PEPCID) 20 MG tablet Take 1 tablet  (20 mg total) by mouth 2 (two) times daily. 60 tablet 3   . levothyroxine (SYNTHROID, LEVOTHROID) 50 MCG tablet Take 50 mcg by mouth Once a day at 6:00am.     . MELATONIN PO Take by mouth nightly.     . nitroglycerin (NITROSTAT) 0.4 MG SL tablet Place 1 tablet (0.4 mg total) under the tongue as needed for Chest pain (q53min x3. call MD or 911 if pain persists). 25 tablet 3   . rosuvastatin (CRESTOR) 10 MG tablet Take 10 mg by mouth daily.  2   . senna-docusate (PERICOLACE) 8.6-50 MG per tablet Take 2 tablets by mouth daily. 30 tablet 0   . metoprolol (LOPRESSOR) 25 MG tablet Take 0.5 tablets (12.5 mg total) by mouth 2 (two) times daily. 30 tablet 3     No current facility-administered medications for this visit.       Review of Systems     General:+ fatigue. Not Present-  Weight Gain and Weight Loss.  Respiratory:Not Present- Bloody sputum, Cough, Dyspnea and Wheezing.  Cardiovascular:rare short-lasting palpitations, DOE. Not Present- Calf, thigh or buttock pain with walking, Difficulty Breathing Lying Down, Edema, Awakening Short of Breath   Gastrointestinal:+ heart burn- largely resolved. Not Present- Bloody Stool, Constipation, Diarrhea, Hematemesis, Indigestion, Nausea and Vomiting.  Neurological:+ dizziness- improved, Not Present-Syncope and Weakness.    Physical Exam     Filed Vitals:    03/06/16 1024   BP: 121/65   Pulse: 79   Height: 1.626 m (5\' 4" )   Weight: 64.411 kg (142 lb)       General:  Patient appears their stated age, well-nourished.  Alert and in no apparent distress.  Eyes: No conjunctivitis, no purulent discharge, no lid lag  ENT:  Hearing grossly intact, Nares patent bilaterally, Lips moist, color appropriate for race.  Respiratory: Clear to auscultation and percussion throughout. Respiratory effort unlabored, chest expansion symmetric.    Cardio: Regular rate and rhythm. Normal S1/S2, no m/r/g.  No carotid bruits or thrills, no JVD.  Extremities: warm, pulses 2+ dp b/l, no peripheral edema;  R wrist cath site is clean, no swelling, b/l radial pulses equal and normal.   GI: Soft, nondistended, nontender.  No guarding or rebound.  Skin: Color appropriate for race, Skin warm, dry, and intact; mild varicose veins in the b/l lower legs.   Psychiatric: Good insight and judgment, oriented to person, place, and time    Labs   08/03/13: TSH 0.2, FT4 2.41  08/09/13: K 4.5, Cr. 0.72, LFT normal, Hg 12.8  08/16/15: Hg 12.1, plt 212, wbc 8.0, BUN 17, Cr. 0.63, Na 139, K 4.2, glucose 208, LFT normal,   04/16/2013: T. Chol 179, Trig 210, HDL 46, LDL 91 mg/dl  4/69/6 : TSH  2.95, FT4 1.83. T. Chol 185, HDL 41, LDL 115, Trig 145  12/03/13: K 4.3, cr. 0.9, LFT normal, TSH 2.5, T4 9.6, UA negative.  01/13/16: hg  13.2, plt 256, BUN 14, Cr. 0.66, K 5.2, Na 142, LFT normal.   EKG   ECG 10/20/14: Atrial paced rhythm. No ST/T abnormalities.   ECG 03/25/15: Atrial paced rhythm, no sig ST/T abnormalities.   ECG 08/12/15: Atrial paced rhythm,  no sig ST/T abnormalities.   ECG 09/02/15: Atrial paced rhythm, no sig ST/T abnormalities.  ECG 11/11/15: Atrial paced rhythm, nonspecific T wave abnormalities.   ECG 12/03/15: Atrial paced rhythm,nonspecific T wave abnormalities.   ECG 12/16/15: Atrial paced rhythm, nonspecific T wave abnormalities.  ECG 01/18/16: Atrial paced rhythm at 60 bpm, nonspecific T wave abnormalities, QTc 424 msec.    ECG 02/02/16:  A- paced rhythm, QTc normal.   ECG 03/06/16: A- paced rhythm.   Follow-Up     Return in about 1 month (around 04/06/2016).    Warmest Regards,    Kelli Hope, MD [facility]  [facility]

## 2016-03-14 ENCOUNTER — Encounter (INDEPENDENT_AMBULATORY_CARE_PROVIDER_SITE_OTHER): Payer: Self-pay

## 2016-03-31 ENCOUNTER — Ambulatory Visit (INDEPENDENT_AMBULATORY_CARE_PROVIDER_SITE_OTHER): Payer: Medicare Other | Admitting: Cardiovascular Disease

## 2016-03-31 ENCOUNTER — Encounter (INDEPENDENT_AMBULATORY_CARE_PROVIDER_SITE_OTHER): Payer: Self-pay | Admitting: Cardiovascular Disease

## 2016-03-31 VITALS — BP 106/63 | HR 99 | Ht 64.0 in | Wt 142.0 lb

## 2016-03-31 DIAGNOSIS — R42 Dizziness and giddiness: Secondary | ICD-10-CM

## 2016-03-31 DIAGNOSIS — I480 Paroxysmal atrial fibrillation: Secondary | ICD-10-CM

## 2016-03-31 DIAGNOSIS — Z950 Presence of cardiac pacemaker: Secondary | ICD-10-CM

## 2016-03-31 NOTE — Progress Notes (Signed)
[facility]    Chief Complaint   Patient presents with   . Atrial Fibrillation     1 month FU. Pt. denies any new/current cardiac symptoms. No recent hospital visits.       Assessment and Plan   1. Fatigue/ dizziness, and periodic DOE  Propafenone- dizzy   -Consider multifactorial including pafib with RVR, neurological problem- vertigo, or ENT problem.   - Referred to Neurology for further evaluation.   - Holding Atorvastatin did not help with the symptoms, consider restarting the med.   - stopping Protonix helped with dizziness.  - currently with minimal symptoms.     2. Paroxysmal atrial fibrillation  CHADS2-VasC score 2. CHADS2 score 1  Flecainide- Tinnitus.   Propafenone- dizzy  Multaq- high copay   Pt's insurance does not cover Eliquis or Xarelto. Pt refused AC with clear understanding of the potential risk of stroke.   Pt reports fatigue, intermittent dizziness and DOE, PPM check showed recurrent pAfib.   Symptom improved; today in atrial paced rhythm, on ECASA 325 mg po qd.   - continue Metoprolol tartrate 12.5 mg po bid PRN , pt does not want to take daily.    3. Pacemaker  07/17/13 pacemaker check: 80% A paced, AT/AF burden 0%, < 1% since 09/24/2009.  12/10/15 PPM check : A paced rhythm, pAfib with RVR, lasting up to 53 minutes.   Functioning well.   - follow in pacemaker clinic    4. Chest pain  Consider due to GERD/esophageal spasm; +/- coronary artery spasm/ myocardial bridge.   Admission on 08/16/15 in [facility], s/p LHC, started on Protonix and Imdur.   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   Asymptomatic  - Continue Pepcid, Maalox PRN   - Follow GERD/ esophageal spasm with Dr. Ramiro Harvest.   - Encourage exercising.     6. Hyperlipidemia  Used to be on Atorvastatin 40 mg po qd, tolerated well,  Atorvastatin 60 mg po qhs- fatigue, weakness, and muscle pain.   Pt does not have  CAD, at her age, no indication for aggressive lipid control. Goal LDL < 130 mg/dl.   - Consider a contributing factor for fatigue.   - Holding Atorvastatin, currently no obvious effect on fatigue. Advise not to start Crestor at this time.     7. Fatigue, unspecified type  02/02/14 nuclear stress test: LVEF 72%, no ischemia  Consider multifactorial; improved after taking Melatonin and exercising   - Encourage continuing  exercising.     8. Hypersomnia/ insomnia  frequently waking up during the night,now improved after taking Melatonin.    - Continue Melatonin    I have spent 15 minutes with the pt, 50% time for counseling and education.     Diagnostic Studies   08/20/13 Echo: nl LVSF, borderline LVH, nl LA size, no sig valvular abnormalities.  07/17/13 pacemaker check: 80% A paced, AT/AF burden 0%, < 1% since 09/24/2009.  09/21/11 nuclear stress: no ischemia  Lexiscan nuclear stress test 08/11/15: a small sized area of  ischemia in the anterior wall, LVEF 75%.   Carotid artery duplex 12/16/15:  Normal R carotid artery, mild dz on the left side.   History of Present Illness   82 y/o female with a PMH of hypthyrodism, pAfib, s/p PPM,  insomnia, prolapsed uterus s/p surgery 02/14/16 , and recent ER visit for CP on 08/07/15, s/p Colpectomy 02/07/16.  Pt has been feeling better since the previous visit, with more energy, and no sig palpitations, dizziness or DOE. No leg swelling. She has not started taking Metoprolol for fear of potential side effect.      Past Medical History     Past Medical History   Diagnosis Date   . Atrial fibrillation    . Ventricular tachycardia    . Hypothyroidism    . Atrial tachycardia    . Hyperlipidemia    . Prolapse of uterus    . Vertigo    . Left arm pain    . SOB (shortness of breath)    . Chest pain    . Pacemaker    . Arrhythmia      Bradycardia   . Constipation    . PNA (pneumonia) 2016     hospitalized over night for pneumonia   . Urinary tract infection 12/2015     on med, no symptoms  presently   . Gastroesophageal reflux disease      on med       Past Surgical History     Past Surgical History   Procedure Laterality Date   . Back surgery  1980   . Mohs surgery     . Insert / replace / remove pacemaker  October 2011   . Dental surgery       Root canal   . Cardiac catheterization  08/16/2015   . Hysterectomy  2004   . Adenoidectomy       age7   . Tonsillectomy       age 77   . Colpectomy N/A 02/07/2016     Procedure: COLPECTOMY, TVT SLING;  Surgeon: Selinda Flavin, MD;  Location: [facility];  Service: Gynecology Urology;  Laterality: N/A;   . Cystoscopy N/A 02/07/2016     Procedure: CYSTOSCOPY;  Surgeon: Selinda Flavin, MD;  Location: [facility];  Service: Gynecology Urology;  Laterality: N/A;       Social History     Social History     Social History   . Marital Status: Married     Spouse Name: N/A   . Number of Children: N/A   . Years of Education: N/A     Occupational History   . Not on file.     Social History Main Topics   . Smoking status: Never Smoker    . Smokeless tobacco: Not on file   . Alcohol Use: 0.6 oz/week     1 Glasses of wine per week      Comment: Glass of wine occasionally   . Drug Use: No   . Sexual Activity: Not on file     Other Topics Concern   . Not on file     Social History Narrative       Family History     Family History   Problem Relation Age of Onset   . Myocardial Infarction Father    . Coronary artery disease Father    . Hyperlipidemia Father    . Hyperlipidemia Sister    . Hypertension Neg Hx        Allergies     Allergies   Allergen Reactions   . Flecainide      Tinnitus,    . Propafenone      dizziness       Medications     Current Outpatient Prescriptions   Medication Sig Dispense Refill   . aspirin EC 325 MG  EC tablet Take 1 tablet (325 mg total) by mouth daily. 90 tablet 3   . Cholecalciferol (VITAMIN D PO) Take by mouth daily.     Marland Kitchen CRANBERRY PO Take by mouth.     . Cyanocobalamin (VITAMIN B 12) 250 MCG Lozenge Take by mouth.     . famotidine  (PEPCID) 20 MG tablet Take 1 tablet (20 mg total) by mouth 2 (two) times daily. 60 tablet 3   . IRON PO Take by mouth.     . levothyroxine (SYNTHROID, LEVOTHROID) 50 MCG tablet Take 50 mcg by mouth Once a day at 6:00am.     . MELATONIN PO Take by mouth nightly.     . nitroglycerin (NITROSTAT) 0.4 MG SL tablet Place 1 tablet (0.4 mg total) under the tongue as needed for Chest pain (q56min x3. call MD or 911 if pain persists). 25 tablet 3   . rosuvastatin (CRESTOR) 10 MG tablet Take 10 mg by mouth daily.  2   . senna-docusate (PERICOLACE) 8.6-50 MG per tablet Take 2 tablets by mouth daily. (Patient taking differently: Take 2 tablets by mouth as needed.    ) 30 tablet 0     No current facility-administered medications for this visit.       Review of Systems     General:+ fatigue. Not Present-  Weight Gain and Weight Loss.  Respiratory:Not Present- Bloody sputum, Cough, Dyspnea and Wheezing.  Cardiovascular:rare short-lasting palpitations, DOE. Not Present- Calf, thigh or buttock pain with walking, Difficulty Breathing Lying Down, Edema, Awakening Short of Breath   Gastrointestinal:+ heart burn- largely resolved. Not Present- Bloody Stool, Constipation, Diarrhea, Hematemesis, Indigestion, Nausea and Vomiting.  Neurological:+ dizziness- improved, Not Present-Syncope and Weakness.    Physical Exam     Filed Vitals:    03/31/16 1049   BP: 106/63   Pulse: 99   Height: 1.626 m (5\' 4" )   Weight: 64.411 kg (142 lb)       General:  Patient appears their stated age, well-nourished.  Alert and in no apparent distress.  Eyes: No conjunctivitis, no purulent discharge, no lid lag  ENT:  Hearing grossly intact, Nares patent bilaterally, Lips moist, color appropriate for race.  Respiratory: Clear to auscultation and percussion throughout. Respiratory effort unlabored, chest expansion symmetric.    Cardio: Regular rate and rhythm. Normal S1/S2, no m/r/g.  No carotid bruits or thrills, no JVD.  Extremities: warm, pulses 2+ dp b/l,  no peripheral edema; R wrist cath site is clean, no swelling, b/l radial pulses equal and normal.   GI: Soft, nondistended, nontender.  No guarding or rebound.  Skin: Color appropriate for race, Skin warm, dry, and intact; mild varicose veins in the b/l lower legs.   Psychiatric: Good insight and judgment, oriented to person, place, and time    Labs   08/03/13: TSH 0.2, FT4 2.41  08/09/13: K 4.5, Cr. 0.72, LFT normal, Hg 12.8  08/16/15: Hg 12.1, plt 212, wbc 8.0, BUN 17, Cr. 0.63, Na 139, K 4.2, glucose 208, LFT normal,   04/16/2013: T. Chol 179, Trig 210, HDL 46, LDL 91 mg/dl  6/96/2 : TSH  9.52, FT4 1.83. T. Chol 185, HDL 41, LDL 115, Trig 145  12/03/13: K 4.3, cr. 0.9, LFT normal, TSH 2.5, T4 9.6, UA negative.  01/13/16: hg  13.2, plt 256, BUN 14, Cr. 0.66, K 5.2, Na 142, LFT normal.   EKG   ECG 10/20/14: Atrial paced rhythm. No ST/T abnormalities.   ECG 03/25/15: Atrial paced  rhythm, no sig ST/T abnormalities.   ECG 08/12/15: Atrial paced rhythm, no sig ST/T abnormalities.   ECG 09/02/15: Atrial paced rhythm, no sig ST/T abnormalities.  ECG 11/11/15: Atrial paced rhythm, nonspecific T wave abnormalities.   ECG 12/03/15: Atrial paced rhythm,nonspecific T wave abnormalities.   ECG 12/16/15: Atrial paced rhythm, nonspecific T wave abnormalities.  ECG 01/18/16: Atrial paced rhythm at 60 bpm, nonspecific T wave abnormalities, QTc 424 msec.    ECG 02/02/16:  A- paced rhythm, QTc normal.   ECG 03/06/16: A- paced rhythm at 60 bpm. QTc 428 msec.   Follow-Up     Return in about 3 months (around 07/01/2016).    Warmest Regards,    Kelli Hope, MD [facility]  [facility]

## 2016-04-10 ENCOUNTER — Other Ambulatory Visit (INDEPENDENT_AMBULATORY_CARE_PROVIDER_SITE_OTHER): Payer: Self-pay | Admitting: Cardiovascular Disease

## 2016-04-10 DIAGNOSIS — K219 Gastro-esophageal reflux disease without esophagitis: Secondary | ICD-10-CM

## 2016-04-10 MED ORDER — FAMOTIDINE 20 MG PO TABS
20.0000 mg | ORAL_TABLET | Freq: Two times a day (BID) | ORAL | Status: DC
Start: 2016-04-10 — End: 2016-08-31

## 2016-04-13 ENCOUNTER — Telehealth (INDEPENDENT_AMBULATORY_CARE_PROVIDER_SITE_OTHER): Payer: Self-pay

## 2016-04-13 ENCOUNTER — Other Ambulatory Visit (INDEPENDENT_AMBULATORY_CARE_PROVIDER_SITE_OTHER): Payer: Self-pay

## 2016-04-13 DIAGNOSIS — I480 Paroxysmal atrial fibrillation: Secondary | ICD-10-CM

## 2016-04-13 NOTE — Telephone Encounter (Addendum)
Left detailed message to call back.   Per Dr Wallace Going - Called patient to follow up on (BMP) blood work. Where and when was blood work done?

## 2016-04-13 NOTE — Telephone Encounter (Signed)
Pt needs order for blood work to be performed

## 2016-04-14 NOTE — Telephone Encounter (Signed)
Pt called from [phone]    Pt received the lab orders.  She states she does not see QZ until 06/2016.    She wants to know when QZ wants her to have these labs done?      Please call.  Thank you

## 2016-04-14 NOTE — Telephone Encounter (Signed)
Pt has been advised to have labs completed prior to next appt with QZ.

## 2016-04-24 NOTE — Progress Notes (Signed)
This encounter was created in error - please disregard.    Items noted as "reviewed" are for administrative purposes only and are not guaranteed by the provider to be accurate on this date.

## 2016-06-13 ENCOUNTER — Ambulatory Visit (INDEPENDENT_AMBULATORY_CARE_PROVIDER_SITE_OTHER): Payer: Medicare Other | Admitting: Cardiovascular Disease

## 2016-06-13 DIAGNOSIS — I472 Ventricular tachycardia, unspecified: Secondary | ICD-10-CM

## 2016-06-13 DIAGNOSIS — Z950 Presence of cardiac pacemaker: Secondary | ICD-10-CM

## 2016-06-13 NOTE — Progress Notes (Signed)
SEE SCANNED DEVICE CHECK, DOS 06/13/2016    REVIEWED BY: Ferd Hibbs, MD, [facility], [facility]  The patients device interrogation was personally reviewed by me, all assessments were noted and patient was notified of findings and changes made, if needed.  Continue with device interrogations as scheduled.   ENTERED BY: Earley Abide, LPN .

## 2016-07-06 ENCOUNTER — Ambulatory Visit (INDEPENDENT_AMBULATORY_CARE_PROVIDER_SITE_OTHER): Payer: Medicare Other | Admitting: Cardiovascular Disease

## 2016-07-06 ENCOUNTER — Encounter (INDEPENDENT_AMBULATORY_CARE_PROVIDER_SITE_OTHER): Payer: Self-pay | Admitting: Cardiovascular Disease

## 2016-07-06 VITALS — BP 107/70 | HR 62 | Ht 64.0 in | Wt 147.0 lb

## 2016-07-06 DIAGNOSIS — Z950 Presence of cardiac pacemaker: Secondary | ICD-10-CM

## 2016-07-06 DIAGNOSIS — R42 Dizziness and giddiness: Secondary | ICD-10-CM

## 2016-07-06 DIAGNOSIS — I480 Paroxysmal atrial fibrillation: Secondary | ICD-10-CM

## 2016-07-06 DIAGNOSIS — R5383 Other fatigue: Secondary | ICD-10-CM

## 2016-07-06 MED ORDER — METOPROLOL TARTRATE 25 MG PO TABS
12.5000 mg | ORAL_TABLET | Freq: Two times a day (BID) | ORAL | 3 refills | Status: DC
Start: 2016-07-06 — End: 2016-08-31

## 2016-07-06 NOTE — Progress Notes (Signed)
[facility]    Chief Complaint   Patient presents with   . Atrial Fibrillation     Patient presents for 3 month follow up for PAF. No cardiac complaints. Patient would like to know what times to take BP.  Last pacemaker check 06/13/2016.  Patient would like to know what medications she needs to take along with the pacemaker for afib. She wants to know if meds like toprol are needed if she has a device.  Last OV and EKG 03/31/2016.       Assessment and Plan   1. Paroxysmal atrial fibrillation  CHADS2-VasC score 2. CHADS2 score 1  Flecainide- Tinnitus.   Propafenone- dizzy  Multaq- high copay   Pt's insurance does not cover Eliquis or Xarelto. Pt refused AC with clear understanding of the potential risk of stroke.   Pt reported fatigue, intermittent dizziness and DOE, PPM check showed recurrent pAfib.   Symptom resolved since taking BB twice a day; today in atrial paced rhythm, on ECASA 325 mg po qd.   - continue Metoprolol tartrate 12.5 mg po bid     2. Pacemaker  07/17/13 pacemaker check: 80% A paced, AT/AF burden 0%, < 1% since 09/24/2009.  12/10/15 PPM check : A paced rhythm, pAfib with RVR, lasting up to 53 minutes.   Functioning well.   06/13/2016 PPM check: No recurrent Afib, normal function,   - follow in pacemaker clinic    3. Fatigue/ dizziness, and periodic DOE  Propafenone- dizzy   -Consider multifactorial including pafib with RVR, neurological problem- vertigo, or ENT problem.   - Holding Atorvastatin did not help with the symptoms, restarted the med.   - stopping Protonix helped with dizziness.  - Fatigue improved, currently asymptomatic.     4. Chest pain  Consider due to GERD/esophageal spasm; +/- coronary artery spasm/ myocardial bridge.   Admission on 08/16/15 in [facility], s/p LHC, started on Protonix and Imdur.   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   Asymptomatic  -  Continue Pepcid, Maalox PRN   - Follow GERD/ esophageal spasm with Dr. Ramiro Harvest.   - Encourage exercising.     6. Hyperlipidemia  Used to be on Atorvastatin 40 mg po qd, tolerated well,  Atorvastatin 60 mg po qhs- fatigue, weakness, and muscle pain.   Pt does not have CAD, at her age, no indication for aggressive lipid control. Goal LDL < 130 mg/dl.   - On atorvastatin    7. Hypersomnia/ insomnia  frequently waking up during the night,now improved after taking Melatonin.    - Continue Melatonin    I have spent 15 minutes with the pt, 50% time for counseling and education.     Diagnostic Studies   08/20/13 Echo: nl LVSF, borderline LVH, nl LA size, no sig valvular abnormalities.  07/17/13 pacemaker check: 80% A paced, AT/AF burden 0%, < 1% since 09/24/2009.  09/21/11 nuclear stress: no ischemia  Lexiscan nuclear stress test 08/11/15: a small sized area of  ischemia in the anterior wall, LVEF 75%.   Carotid artery duplex 12/16/15:  Normal R carotid artery, mild dz on the left side.   02/02/14 nuclear stress test: LVEF 72%, no ischemia    History of Present Illness   82 y/o female with a PMH of hypthyrodism, pAfib, s/p PPM,  insomnia, prolapsed uterus s/p surgery 02/14/16 , and recent ER visit for CP  on 08/07/15, s/p Colpectomy 02/07/16.      Pt has been feeling well since the previous visit. She initially had some LH occasionally, and started to take Metoprolol twice a day since last week. Since then, no recurrent LH. No CP, SOB ,palpitations, dizziness or LH. No leg swelling. She recently had a pacemaker check , which is functioning normal.     Past Medical History     Past Medical History:   Diagnosis Date   . Arrhythmia     Bradycardia   . Atrial fibrillation    . Atrial tachycardia    . Chest pain    . Constipation    . Gastroesophageal reflux disease     on med   . Hyperlipidemia    . Hypothyroidism    . Left arm pain    . Pacemaker    . PNA (pneumonia) 2016    hospitalized over night for pneumonia   . Prolapse of uterus     . SOB (shortness of breath)    . Urinary tract infection 12/2015    on med, no symptoms presently   . Ventricular tachycardia    . Vertigo        Past Surgical History     Past Surgical History:   Procedure Laterality Date   . ADENOIDECTOMY      age7   . BACK SURGERY  1980   . CARDIAC CATHETERIZATION  08/16/2015   . COLPECTOMY N/A 02/07/2016    Procedure: COLPECTOMY, TVT SLING;  Surgeon: Selinda Flavin, MD;  Location: [facility];  Service: Gynecology Urology;  Laterality: N/A;   . CYSTOSCOPY N/A 02/07/2016    Procedure: CYSTOSCOPY;  Surgeon: Selinda Flavin, MD;  Location: [facility];  Service: Gynecology Urology;  Laterality: N/A;   . DENTAL SURGERY      Root canal   . HYSTERECTOMY  2004   . INSERT / REPLACE / REMOVE PACEMAKER  October 2011   . MOHS SURGERY     . TONSILLECTOMY      age 43       Social History     Social History     Social History   . Marital status: Married     Spouse name: N/A   . Number of children: N/A   . Years of education: N/A     Occupational History   . Not on file.     Social History Main Topics   . Smoking status: Never Smoker   . Smokeless tobacco: Never Used   . Alcohol use 0.6 oz/week     1 Glasses of wine per week      Comment: Glass of wine occasionally   . Drug use: No   . Sexual activity: Not on file     Other Topics Concern   . Not on file     Social History Narrative   . No narrative on file       Family History     Family History   Problem Relation Age of Onset   . Myocardial Infarction Father    . Coronary artery disease Father    . Hyperlipidemia Father    . Hyperlipidemia Sister    . Hypertension Neg Hx        Allergies     Allergies   Allergen Reactions   . Flecainide      Tinnitus,    . Propafenone      dizziness  Medications     Current Outpatient Prescriptions   Medication Sig Dispense Refill   . aspirin EC 325 MG EC tablet Take 1 tablet (325 mg total) by mouth daily. 90 tablet 3   . Cholecalciferol (VITAMIN D PO) Take by mouth daily.     Marland Kitchen CRANBERRY  PO Take by mouth.     . Cyanocobalamin (VITAMIN B 12) 250 MCG Lozenge Take by mouth.     . famotidine (PEPCID) 20 MG tablet Take 1 tablet (20 mg total) by mouth 2 (two) times daily. 180 tablet 3   . IRON PO Take by mouth.     . levothyroxine (SYNTHROID, LEVOTHROID) 50 MCG tablet Take 50 mcg by mouth every morning.         Marland Kitchen MELATONIN PO Take by mouth nightly.     . nitroglycerin (NITROSTAT) 0.4 MG SL tablet Place 1 tablet (0.4 mg total) under the tongue as needed for Chest pain (q2min x3. call MD or 911 if pain persists). 25 tablet 3   . rosuvastatin (CRESTOR) 10 MG tablet Take 10 mg by mouth daily.  2   . senna-docusate (PERICOLACE) 8.6-50 MG per tablet Take 2 tablets by mouth daily. (Patient taking differently: Take 2 tablets by mouth as needed.    ) 30 tablet 0   . metoprolol tartrate (LOPRESSOR) 25 MG tablet Take 0.5 tablets (12.5 mg total) by mouth 2 (two) times daily. 90 tablet 3     No current facility-administered medications for this visit.        Review of Systems     General:+ fatigue-improved. Not Present-  Weight Gain and Weight Loss.  Respiratory:Not Present- Bloody sputum, Cough, Dyspnea and Wheezing.  Cardiovascular: DOE- improved. Not Present- Calf, thigh or buttock pain with walking, Difficulty Breathing Lying Down, Edema, Awakening Short of Breath   Gastrointestinal: Not Present- Bloody Stool, Constipation, Diarrhea, Hematemesis, Indigestion, Nausea and Vomiting.  Neurological:Not Present-dizzy, Syncope and Weakness.    Physical Exam     Vitals:    07/06/16 1044   BP: 107/70   Pulse: 62   Weight: 66.7 kg (147 lb)   Height: 1.626 m (5\' 4" )       General:  Patient appears their stated age, well-nourished.  Alert and in no apparent distress.  Eyes: No conjunctivitis, no purulent discharge, no lid lag  ENT:  Hearing grossly intact, Nares patent bilaterally, Lips moist, color appropriate for race.  Respiratory: Clear to auscultation and percussion throughout. Respiratory effort unlabored, chest  expansion symmetric.    Cardio: Regular rate and rhythm. Normal S1/S2, no m/r/g.  No carotid bruits or thrills, no JVD.  Extremities: warm, pulses 2+ dp b/l, no peripheral edema; R wrist cath site is clean, no swelling, b/l radial pulses equal and normal.   GI: Soft, nondistended, nontender.  No guarding or rebound.  Skin: Color appropriate for race, Skin warm, dry, and intact; mild varicose veins in the b/l lower legs.   Psychiatric: Good insight and judgment, oriented to person, place, and time    Labs   08/03/13: TSH 0.2, FT4 2.41  08/09/13: K 4.5, Cr. 0.72, LFT normal, Hg 12.8  08/16/15: Hg 12.1, plt 212, wbc 8.0, BUN 17, Cr. 0.63, Na 139, K 4.2, glucose 208, LFT normal,   04/16/2013: T. Chol 179, Trig 210, HDL 46, LDL 91 mg/dl  0/45/4 : TSH  0.98, FT4 1.83. T. Chol 185, HDL 41, LDL 115, Trig 145  12/03/13: K 4.3, cr. 0.9, LFT normal, TSH 2.5, T4  9.6, UA negative.  01/13/16: Hg  13.2, plt 256, BUN 14, Cr. 0.66, K 5.2, Na 142, LFT normal.   EKG   ECG 10/20/14: Atrial paced rhythm. No ST/T abnormalities.   ECG 03/25/15: Atrial paced rhythm, no sig ST/T abnormalities.   ECG 08/12/15: Atrial paced rhythm, no sig ST/T abnormalities.   ECG 09/02/15: Atrial paced rhythm, no sig ST/T abnormalities.  ECG 11/11/15: Atrial paced rhythm, nonspecific T wave abnormalities.   ECG 12/03/15: Atrial paced rhythm,nonspecific T wave abnormalities.   ECG 12/16/15: Atrial paced rhythm, nonspecific T wave abnormalities.  ECG 01/18/16: Atrial paced rhythm at 60 bpm, nonspecific T wave abnormalities, QTc 424 msec.    ECG 02/02/16:  A- paced rhythm, QTc normal.   ECG 03/06/16: A- paced rhythm at 60 bpm. QTc 428 msec.   ECG  07/06/16: A-paced rhythm at 65 bpm.   Follow-Up     Return in about 3 months (around 10/06/2016).    Warmest Regards,    Kelli Hope, MD PhD  [facility]       .

## 2016-08-04 ENCOUNTER — Encounter (INDEPENDENT_AMBULATORY_CARE_PROVIDER_SITE_OTHER): Payer: Self-pay | Admitting: Cardiovascular Disease

## 2016-08-04 ENCOUNTER — Ambulatory Visit (INDEPENDENT_AMBULATORY_CARE_PROVIDER_SITE_OTHER): Payer: Medicare Other | Admitting: Cardiovascular Disease

## 2016-08-04 VITALS — BP 112/58 | HR 76 | Ht 64.5 in | Wt 145.2 lb

## 2016-08-04 DIAGNOSIS — I480 Paroxysmal atrial fibrillation: Secondary | ICD-10-CM

## 2016-08-04 DIAGNOSIS — E785 Hyperlipidemia, unspecified: Secondary | ICD-10-CM

## 2016-08-04 DIAGNOSIS — M791 Myalgia, unspecified site: Secondary | ICD-10-CM

## 2016-08-04 NOTE — Progress Notes (Signed)
[facility]    Chief Complaint   Patient presents with   . Atrial Fibrillation     Follow up visit last seen 07/06/2016. Patient c/o being fatigue, and discomfort in her chest pain back. No test ordered last visit.        Assessment and Plan   1. Paroxysmal atrial fibrillation  CHADS2-VasC score 2. CHADS2 score 1  Flecainide- Tinnitus.   Propafenone- dizzy  Multaq- high copay   Pt's insurance does not cover Eliquis or Xarelto. Pt refused AC with clear understanding of the potential risk of stroke.   Pt reported fatigue, intermittent dizziness and DOE, PPM check showed recurrent pAfib.   Symptom resolved since taking BB twice a day; stable in atrial paced rhythm, on ECASA 325 mg po qd.   - continue Metoprolol tartrate 12.5 mg po bid.     2. Pacemaker  07/17/13 pacemaker check: 80% A paced, AT/AF burden 0%, < 1% since 09/24/2009.  12/10/15 PPM check : A paced rhythm, pAfib with RVR, lasting up to 53 minutes.   Functioning well.   06/13/2016 PPM check: No recurrent Afib, normal function,   - follow in pacemaker clinic    3. Hyperlipidemia/ muscle ache  Used to be on Atorvastatin 40 mg po qd, tolerated well,  Atorvastatin 60 mg po qhs- fatigue, weakness, and muscle pain.   Pt does not have CAD, at her age, no indication for aggressive lipid control. Goal LDL < 130 mg/dl.   - now on Crestor, increased to 20 mg po qd recently , now c/o muscle pain in the arm and shoulder, will decrease Crestor back down to 10 mg po qd.     4. Fatigue/ dizziness, and periodic DOE  Propafenone- dizzy   -Consider multifactorial including pafib with RVR, neurological problem- vertigo, or ENT problem.   - Holding Atorvastatin did not help with the symptoms, restarted the med.   - stopping Protonix helped with dizziness.  - Fatigue improved, currently asymptomatic.     5. Chest pain  Consider due to GERD/esophageal spasm; +/- coronary artery spasm/ myocardial bridge.   Admission on 08/16/15 in [facility], s/p LHC, started on Protonix and Imdur.    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   Asymptomatic  - Continue Pepcid, Maalox PRN   - Follow GERD/ esophageal spasm with Dr. Ramiro Harvest.   - Encourage exercising.     6. Hypersomnia/ insomnia  frequently waking up during the night,now improved after taking Melatonin.    - Continue Melatonin    I have spent 25 minutes with the pt, 50% time for counseling and education.     Diagnostic Studies   08/20/13 Echo: nl LVSF, borderline LVH, nl LA size, no sig valvular abnormalities.  07/17/13 pacemaker check: 80% A paced, AT/AF burden 0%, < 1% since 09/24/2009.  09/21/11 nuclear stress: no ischemia  Lexiscan nuclear stress test 08/11/15: a small sized area of  ischemia in the anterior wall, LVEF 75%.   Carotid artery duplex 12/16/15:  Normal R carotid artery, mild dz on the left side.   02/02/14 nuclear stress test: LVEF 72%, no ischemia    History of Present Illness   81 y/o female with a PMH of hypthyrodism, pAfib, s/p PPM,  insomnia, prolapsed uterus s/p surgery 02/14/16 , and recent ER visit for CP on 08/07/15, s/p Colpectomy 02/07/16, and tinnitus/vertigo, who presents for follow-up.  Pt complains of arm and shoulder aching for the past week. She reports Crestor was recently increased to 20 mg po qd. She  Also feels slightly more tired. No CP, SOB or palpitations. No LE swelling.     Past Medical History     Past Medical History:   Diagnosis Date   . Arrhythmia     Bradycardia   . Atrial fibrillation    . Atrial tachycardia    . Chest pain    . Constipation    . Gastroesophageal reflux disease     on med   . Hyperlipidemia    . Hypothyroidism    . Left arm pain    . Pacemaker    . PNA (pneumonia) 2016    hospitalized over night for pneumonia   . Prolapse of uterus    . SOB (shortness of breath)    . Urinary tract infection 12/2015    on med, no symptoms presently   . Ventricular tachycardia     . Vertigo        Past Surgical History     Past Surgical History:   Procedure Laterality Date   . ADENOIDECTOMY      age7   . BACK SURGERY  1980   . CARDIAC CATHETERIZATION  08/16/2015   . COLPECTOMY N/A 02/07/2016    Procedure: COLPECTOMY, TVT SLING;  Surgeon: Selinda Flavin, MD;  Location: [facility];  Service: Gynecology Urology;  Laterality: N/A;   . CYSTOSCOPY N/A 02/07/2016    Procedure: CYSTOSCOPY;  Surgeon: Selinda Flavin, MD;  Location:  [facility];  Service: Gynecology Urology;  Laterality: N/A;   . DENTAL SURGERY      Root canal   . HYSTERECTOMY  2004   . INSERT / REPLACE / REMOVE PACEMAKER  October 2011   . MOHS SURGERY     . TONSILLECTOMY      age 39       Social History     Social History     Social History   . Marital status: Married     Spouse name: N/A   . Number of children: N/A   . Years of education: N/A     Occupational History   . Not on file.     Social History Main Topics   . Smoking status: Never Smoker   . Smokeless tobacco: Never Used   . Alcohol use 0.6 oz/week     1 Glasses of wine per week      Comment: Glass of wine occasionally   . Drug use: No   . Sexual activity: Not on file     Other Topics Concern   . Not on file     Social History Narrative   . No narrative on file       Family History     Family History   Problem Relation Age of Onset   . Myocardial Infarction Father    . Coronary artery disease Father    . Hyperlipidemia Father    . Hyperlipidemia Sister    . Hypertension Neg Hx        Allergies     Allergies   Allergen Reactions   . Flecainide      Tinnitus,    . Propafenone      dizziness       Medications     Current Outpatient Prescriptions   Medication Sig Dispense Refill   . aspirin EC 325 MG EC tablet  Take 1 tablet (325 mg total) by mouth daily. 90 tablet 3   . Cholecalciferol (VITAMIN D PO) Take by mouth daily.     Marland Kitchen CRANBERRY PO Take by mouth.     . Cyanocobalamin (VITAMIN B 12) 250 MCG Lozenge Take by mouth.     . famotidine (PEPCID) 20 MG tablet Take  1 tablet (20 mg total) by mouth 2 (two) times daily. 180 tablet 3   . IRON PO Take by mouth.     . levothyroxine (SYNTHROID, LEVOTHROID) 50 MCG tablet Take 50 mcg by mouth every morning.         . metoprolol tartrate (LOPRESSOR) 25 MG tablet Take 0.5 tablets (12.5 mg total) by mouth 2 (two) times daily. 90 tablet 3   . nitroglycerin (NITROSTAT) 0.4 MG SL tablet Place 1 tablet (0.4 mg total) under the tongue as needed for Chest pain (q61min x3. call MD or 911 if pain persists). 25 tablet 3   . rosuvastatin (CRESTOR) 10 MG tablet Take 10 mg by mouth daily.  2   . senna-docusate (PERICOLACE) 8.6-50 MG per tablet Take 2 tablets by mouth daily. (Patient taking differently: Take 2 tablets by mouth as needed.    ) 30 tablet 0     No current facility-administered medications for this visit.        Review of Systems     General:+ fatigue- slightly worse Not Present-  Weight Gain and Weight Loss.  Respiratory:Not Present- Bloody sputum, Cough, Dyspnea and Wheezing.  Cardiovascular: DOE- improved. Not Present- Calf, thigh or buttock pain with walking, Difficulty Breathing Lying Down, Edema, Awakening Short of Breath   Gastrointestinal: Not Present- Bloody Stool, Constipation, Diarrhea, Hematemesis, Indigestion, Nausea and Vomiting.  Neurological:Not Present-dizzy, Syncope and Weakness.    Physical Exam     Vitals:    08/04/16 1639   BP: 112/58   Pulse: 76   Weight: 65.9 kg (145 lb 3.2 oz)   Height: 1.638 m (5' 4.5")       General:  Patient appears their stated age, well-nourished.  Alert and in no apparent distress.  Eyes: No conjunctivitis, no purulent discharge, no lid lag  ENT:  Hearing grossly intact, Nares patent bilaterally, Lips moist, color appropriate for race.  Respiratory: Clear to auscultation and percussion throughout. Respiratory effort unlabored, chest expansion symmetric.    Cardio: Regular rate and rhythm. Normal S1/S2, no m/r/g.  No carotid bruits or thrills, no JVD.  Extremities: warm, pulses 2+ dp b/l,  no peripheral edema; R wrist cath site is clean, no swelling, b/l radial pulses equal and normal.   GI: Soft, nondistended, nontender.  No guarding or rebound.  Skin: Color appropriate for race, Skin warm, dry, and intact; mild varicose veins in the b/l lower legs.   Psychiatric: Good insight and judgment, oriented to person, place, and time    Labs   08/03/13: TSH 0.2, FT4 2.41  08/09/13: K 4.5, Cr. 0.72, LFT normal, Hg 12.8  08/16/15: Hg 12.1, plt 212, wbc 8.0, BUN 17, Cr. 0.63, Na 139, K 4.2, glucose 208, LFT normal,   04/16/2013: T. Chol 179, Trig 210, HDL 46, LDL 91 mg/dl  12/05/79 : TSH  1.91, FT4 1.83. T. Chol 185, HDL 41, LDL 115, Trig 145  12/03/13: K 4.3, cr. 0.9, LFT normal, TSH 2.5, T4 9.6, UA negative.  01/13/16: Hg  13.2, plt 256, BUN 14, Cr. 0.66, K 5.2, Na 142, LFT normal.  07/25/16: T. Chol 252, Trig 269, LDL  142,  HDL 56, BUn 11, Cr. 0.71, glucose 126, Na 143, Ca 10.4, LFT normal. Hg 14.5.   04/18/2016: TSH 2.71.      EKG   ECG 10/20/14: Atrial paced rhythm. No ST/T abnormalities.   ECG 03/25/15: Atrial paced rhythm, no sig ST/T abnormalities.   ECG 08/12/15: Atrial paced rhythm, no sig ST/T abnormalities.   ECG 09/02/15: Atrial paced rhythm, no sig ST/T abnormalities.  ECG 11/11/15: Atrial paced rhythm, nonspecific T wave abnormalities.   ECG 12/03/15: Atrial paced rhythm,nonspecific T wave abnormalities.   ECG 12/16/15: Atrial paced rhythm, nonspecific T wave abnormalities.  ECG 01/18/16: Atrial paced rhythm at 60 bpm, nonspecific T wave abnormalities, QTc 424 msec.    ECG 02/02/16:  A- paced rhythm, QTc normal.   ECG 03/06/16: A- paced rhythm at 60 bpm. QTc 428 msec.   ECG  07/06/16: A-paced rhythm at 65 bpm.   Follow-Up     Return in about 1 month (around 09/04/2016).    Warmest Regards,    Kelli Hope, MD PhD  [facility]       .

## 2016-08-07 ENCOUNTER — Encounter (INDEPENDENT_AMBULATORY_CARE_PROVIDER_SITE_OTHER): Payer: Self-pay | Admitting: Cardiovascular Disease

## 2016-08-07 ENCOUNTER — Telehealth (INDEPENDENT_AMBULATORY_CARE_PROVIDER_SITE_OTHER): Payer: Self-pay

## 2016-08-07 ENCOUNTER — Ambulatory Visit (INDEPENDENT_AMBULATORY_CARE_PROVIDER_SITE_OTHER): Payer: Medicare Other | Admitting: Cardiovascular Disease

## 2016-08-07 VITALS — BP 150/72 | HR 78 | Ht 64.5 in | Wt 148.0 lb

## 2016-08-07 DIAGNOSIS — I480 Paroxysmal atrial fibrillation: Secondary | ICD-10-CM

## 2016-08-07 NOTE — Telephone Encounter (Addendum)
Pt called stating that she has been having feeling of chest heaviness, tingling in legs and increased SOB for the past few days. Pt states that she feels the symptoms worse in that morning and symptoms seem to resolve throughout the day. Denies any other symptoms.     Pt reports B/P of 141/69 this morning. Does not have HR or any additional vitals to report. Pt advised to be seen in office this morning for further evaluation. Expressed understanding.

## 2016-08-07 NOTE — Progress Notes (Signed)
[facility]    Chief Complaint   Patient presents with   . Atrial Fibrillation     FU to medication side effects to metoprolol (chest pressure, left arm discomfort) last seen 12/08 by QZ.       Assessment and Plan       1. Paroxysmal atrial fibrillation  Age, female (CHADS-VASC 3)  HAS-BLED is 1    We reviewed anticoagulation.  She is having low burden of PAF based on device interrogation report scanned into EPIC.  EGMs were provided from June 2017.  She reported intolerance to many medications to Dr. Dion Body in the past.    I asked her to keep a diary of her symptoms moving forward to see if we can correlate symptoms to Afib episodes, which she believes may still be occurring.  She is comfortable remaining off of anticoagulation when I re-broached this with her today.   She had declined anticoagulation with Dr. Dion Body previously.  We will use the pacemaker check next month (off beta blockers) to help decide on anticoagulation with her.  For now, she wants to remain on ASA alone.    Plan:  Stop beta blockers and see how you do  Keep a diary of your symptoms over the next month, with exact dates and times and duration of your symptoms  Schedule a pacemaker check in one month  See Dr. Ramiro Harvest for anemia w/u if needed (pt reports fatigue and has history of anemia)    2. Abnormal stress test  Had LHC in 07/2016.  Sees Dr. Dion Body for this    - ECG 12 lead (Today)          History of Present Illness     Here in f/u.  She was started on beta blockers.  She reports chest pressure, left arm discomfort (2/10 in intensity).  She reports these symptoms come/go.  These symptoms do not occur with activity.  They occur without activity.    Patient had LHC in 07/2015 following a mildly abnormal stress test.  EKG APVS, no ischemia.    She reports she feels all of her symptoms reported above are due to initiation of beta blockers.  She feels worse on them.  She is here for advice.    Past Medical History     Past Medical History:    Diagnosis Date   . Arrhythmia     Bradycardia   . Atrial fibrillation    . Atrial tachycardia    . Chest pain    . Constipation    . Gastroesophageal reflux disease     on med   . Hyperlipidemia    . Hypothyroidism    . Left arm pain    . Pacemaker    . PNA (pneumonia) 2016    hospitalized over night for pneumonia   . Prolapse of uterus    . SOB (shortness of breath)    . Urinary tract infection 12/2015    on med, no symptoms presently   . Ventricular tachycardia    . Vertigo        Past Surgical History     Past Surgical History:   Procedure Laterality Date   . ADENOIDECTOMY      age7   . BACK SURGERY  1980   . CARDIAC CATHETERIZATION  08/16/2015   . COLPECTOMY N/A 02/07/2016    Procedure: COLPECTOMY, TVT SLING;  Surgeon: Selinda Flavin, MD;  Location: [facility];  Service: Gynecology Urology;  Laterality: N/A;   . CYSTOSCOPY N/A 02/07/2016    Procedure: CYSTOSCOPY;  Surgeon: Selinda Flavin, MD;  Location: [facility];  Service: Gynecology Urology;  Laterality: N/A;   . DENTAL SURGERY      Root canal   . HYSTERECTOMY  2004   . INSERT / REPLACE / REMOVE PACEMAKER  October 2011   . MOHS SURGERY     . TONSILLECTOMY      age 71       Family History     Family History   Problem Relation Age of Onset   . Myocardial Infarction Father    . Coronary artery disease Father    . Hyperlipidemia Father    . Hyperlipidemia Sister    . Hypertension Neg Hx        Social History     Social History     Social History   . Marital status: Married     Spouse name: N/A   . Number of children: N/A   . Years of education: N/A     Occupational History   . Not on file.     Social History Main Topics   . Smoking status: Never Smoker   . Smokeless tobacco: Never Used   . Alcohol use 0.6 oz/week     1 Glasses of wine per week      Comment: Glass of wine occasionally   . Drug use: No   . Sexual activity: Not on file     Other Topics Concern   . Not on file     Social History Narrative   . No narrative on file       Allergies      Allergies   Allergen Reactions   . Flecainide      Tinnitus,    . Propafenone      dizziness       Medications     Current Outpatient Prescriptions   Medication Sig Dispense Refill   . aspirin EC 325 MG EC tablet Take 1 tablet (325 mg total) by mouth daily. 90 tablet 3   . Cholecalciferol (VITAMIN D PO) Take by mouth daily.     Marland Kitchen CRANBERRY PO Take by mouth.     . Cyanocobalamin (VITAMIN B 12) 250 MCG Lozenge Take by mouth.     . famotidine (PEPCID) 20 MG tablet Take 1 tablet (20 mg total) by mouth 2 (two) times daily. 180 tablet 3   . levothyroxine (SYNTHROID, LEVOTHROID) 50 MCG tablet Take 50 mcg by mouth every morning.         . metoprolol tartrate (LOPRESSOR) 25 MG tablet Take 0.5 tablets (12.5 mg total) by mouth 2 (two) times daily. 90 tablet 3   . nitroglycerin (NITROSTAT) 0.4 MG SL tablet Place 1 tablet (0.4 mg total) under the tongue as needed for Chest pain (q59min x3. call MD or 911 if pain persists). 25 tablet 3   . rosuvastatin (CRESTOR) 10 MG tablet Take 10 mg by mouth daily.  2   . senna-docusate (PERICOLACE) 8.6-50 MG per tablet Take 2 tablets by mouth daily. (Patient taking differently: Take 2 tablets by mouth as needed.    ) 30 tablet 0     No current facility-administered medications for this visit.          Review of Systems     Constitutional: Negative for fevers and chills  Skin: No rash or lesions  Respiratory: Negative for cough, wheezing, or hemoptysis  Cardiovascular: as per HPI  Gastrointestinal: Negative for abdominal pain, nausea, vomiting and diarrhea  Musculoskeletal:  No arthritic symptoms  Genitourinary: Negative for dysuria  Otherwise 10 point review of systems is negative.      Physical Exam     Vitals:    08/07/16 1017   BP: 150/72   Pulse: 78       Body mass index is 25.01 kg/m.    General:  Patient appears their stated age, well-nourished.  Alert and in no apparent distress.  Eyes: No conjunctivitis, no purulent discharge, no lid lag  ENT:  Hearing grossly intact, Nares patent  bilaterally, Lips moist, color appropriate for race.  Respiratory: Clear to auscultation and percussion throughout. Respiratory effort unlabored, chest expansion symmetric.    Cardio: Regular rate and rhythm. Normal S1/S2 No carotid bruits or thrills, no JVD.  Extremities: warm, pulses 2+, no peripheral edema  GI: Soft, nondistended, nontender.  No guarding or rebound.  Skin: Color appropriate for race, Skin warm, dry, and intact  Psychiatric: Good insight and judgment, oriented to person, place, and time    Labs     Lipid Panel No results found for: CHOL, TRIG, HDL  CBC   Lab Results   Component Value Date    HGB 9.6 (L) 02/08/2016    HCT 28.7 (L) 02/08/2016      BMPNo results found for: GLUCOSE, SODIUM, POTASSIUM, CHLORIDE, CO2, BUN, CREATININE  INR No results found for: INR, PROTIME     EKG   I have reviewed and interpreted the EKG.  The EKG is significant for APVS        I spent 40 mins with the patient, >50% of the time was spent on education and counseling.      Renard Hamper, MD, MS    [facility]

## 2016-08-15 ENCOUNTER — Telehealth (INDEPENDENT_AMBULATORY_CARE_PROVIDER_SITE_OTHER): Payer: Self-pay

## 2016-08-15 NOTE — Telephone Encounter (Signed)
Pt called stating that she is having increased acid reflux and feeling of weakness in arms and legs. Pt states that she has been experiencing these symptoms for about a week. Pt states that she does not feel that she needs to be seen in the ER for symptoms. Pt would like to speak with QZ directly. Please contact Pt. Thank you.     * Pt was taken off of BB at last OV with LI.    B/P- 165/82 HR 81      OV:     Assessment and Plan   1. Paroxysmal atrial fibrillation  CHADS2-VasC score 2. CHADS2 score 1  Flecainide- Tinnitus.   Propafenone- dizzy  Multaq- high copay   Pt's insurance does not cover Eliquis or Xarelto. Pt refused AC with clear understanding of the potential risk of stroke.   Pt reported fatigue, intermittent dizziness and DOE, PPM check showed recurrent pAfib.   Symptom resolved since taking BB twice a day; stable in atrial paced rhythm, on ECASA 325 mg po qd.   - continue Metoprolol tartrate 12.5 mg po bid.     2. Pacemaker  07/17/13 pacemaker check: 80% A paced, AT/AF burden 0%, < 1% since 09/24/2009.  12/10/15 PPM check : A paced rhythm, pAfib with RVR, lasting up to 53 minutes.   Functioning well.   06/13/2016 PPM check: No recurrent Afib, normal function,   - follow in pacemaker clinic    3. Hyperlipidemia/ muscle ache  Used to be on Atorvastatin 40 mg po qd, tolerated well,  Atorvastatin 60 mg po qhs- fatigue, weakness, and muscle pain.   Pt does not have CAD, at her age, no indication for aggressive lipid control. Goal LDL < 130 mg/dl.   - now on Crestor, increased to 20 mg po qd recently , now c/o muscle pain in the arm and shoulder, will decrease Crestor back down to 10 mg po qd.     4. Fatigue/ dizziness, and periodic DOE  Propafenone- dizzy   -Consider multifactorial including pafib with RVR, neurological problem- vertigo, or ENT problem.   - Holding Atorvastatin did not help with the symptoms, restarted the med.   - stopping Protonix helped with dizziness.  - Fatigue improved, currently  asymptomatic.     5. Chest pain  Consider due to GERD/esophageal spasm; +/- coronary artery spasm/ myocardial bridge.   Admission on 08/16/15 in [facility], s/p LHC, started on Protonix and Imdur.   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   Asymptomatic  - Continue Pepcid, Maalox PRN   - Follow GERD/ esophageal spasm with Dr. Ramiro Harvest.   - Encourage exercising.     6. Hypersomnia/ insomnia  frequently waking up during the night,now improved after taking Melatonin.    - Continue Melatonin

## 2016-08-16 NOTE — Telephone Encounter (Signed)
Noted! Thank you

## 2016-08-16 NOTE — Telephone Encounter (Signed)
I called pt,who states that she stopped taking Metoprolol on 12/11 as instructed by Dr. Maurine Cane but still with fatigue in the leg . She has decreased Crestor to 10 but still with a lot of muscle pain.   She went to ER yesterday due to worsening GERD and LE weakness. All test results were unremarkable. She started to take Nexium herself yesterday with some improvement. Will see a GI doctor soon.   - I instructed pt to restart taking Metoprolol tartrate 12.5 mg po bid   - Hold Crestor  - Nexium bid x one week, then down to once a day while waiting to see GI doctor. Adjust med per GI doctor then.     Thanks  Dr. Dion Body

## 2016-08-31 ENCOUNTER — Ambulatory Visit (INDEPENDENT_AMBULATORY_CARE_PROVIDER_SITE_OTHER): Payer: Medicare Other | Admitting: Cardiovascular Disease

## 2016-08-31 ENCOUNTER — Encounter (INDEPENDENT_AMBULATORY_CARE_PROVIDER_SITE_OTHER): Payer: Self-pay | Admitting: Cardiovascular Disease

## 2016-08-31 VITALS — BP 100/58 | HR 60 | Ht 64.0 in | Wt 147.0 lb

## 2016-08-31 DIAGNOSIS — R200 Anesthesia of skin: Secondary | ICD-10-CM

## 2016-08-31 DIAGNOSIS — E785 Hyperlipidemia, unspecified: Secondary | ICD-10-CM

## 2016-08-31 DIAGNOSIS — R5383 Other fatigue: Secondary | ICD-10-CM

## 2016-08-31 DIAGNOSIS — I480 Paroxysmal atrial fibrillation: Secondary | ICD-10-CM

## 2016-08-31 MED ORDER — METOPROLOL TARTRATE 25 MG PO TABS
12.5000 mg | ORAL_TABLET | Freq: Two times a day (BID) | ORAL | 3 refills | Status: DC
Start: 2016-08-31 — End: 2020-12-14

## 2016-08-31 NOTE — Progress Notes (Signed)
[facility]    Chief Complaint   Patient presents with   . Atrial Fibrillation     Patient present for follow up of afib after decreasing ASA to 81 mg and she reports she is taking lopressor still but only once a day at 12.5 mg.  No cardiac complaints. She mentions numbness and fatigue in her legs in the morning and late at night when she needs to go to the bathroom. Last OV and EKG 08/07/2016       Assessment and Plan   1. Paroxysmal atrial fibrillation  CHADS2-VasC score 2. CHADS2 score 1  Flecainide- Tinnitus.   Propafenone- dizzy  Multaq- high copay   Pt's insurance does not cover Eliquis or Xarelto. Pt refused AC with clear understanding of the potential risk of stroke.   Pt reported fatigue, intermittent dizziness and DOE, PPM check showed recurrent low burden of pAfib.   Symptom resolved since taking BB twice a day; stable in atrial paced rhythm, on ECASA 81 mg po qd.   - Changing Metoprolol to once a day did not help with fatigue; change Metoprolol tartrate back to 12.5 mg po bid. Observation    2. Numbness in the b/l upper and lower extremities , and weakness in the legs  - Refer to Neurology for evaluation.   - Pt also c/o chronic fatigue; consider multifactorial , but not cardiac.     3. Pacemaker  07/17/13 pacemaker check: 80% A paced, AT/AF burden 0%, < 1% since 09/24/2009.  12/10/15 PPM check : A paced rhythm, pAfib with RVR, lasting up to 53 minutes.   Functioning well.   06/13/2016 PPM check: No recurrent Afib, normal function,   - follow in pacemaker clinic    4.  Hyperlipidemia/ muscle ache  Used to be on Atorvastatin 40 mg po qd, tolerated well,  Atorvastatin 60 mg po qhs- fatigue, weakness, and muscle pain.   Pt does not have CAD, at her age, no indication for aggressive lipid control. Goal LDL < 130 mg/dl.   - Pt reports arm and shoulder pain when on Crestor; pain resolved after holding Crestor. Will continue to hold, consider starting on pravastatin at the next visit.    5. Fatigue/  dizziness, and periodic DOE  Propafenone- dizzy   -Consider multifactorial including pafib with RVR, neurological problem- vertigo, or ENT problem.   - Holding Atorvastatin did not help with the symptoms, restarted the med.   - stopping Protonix helped with dizziness.  - Fatigue improved, currently asymptomatic.     5. Chest pain  Consider due to GERD/esophageal spasm; +/- coronary artery spasm/ myocardial bridge.   Admission on 08/16/15 in [facility], s/p LHC, started on Protonix and Imdur.   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   Asymptomatic  - Continue Pepcid, Maalox PRN   - Follow GERD/ esophageal spasm with Dr. Ramiro Harvest.   - Encourage exercising.     6. Hypersomnia/ insomnia  frequently waking up during the night,now improved after taking Melatonin.    - Continue Melatonin    I have spent 25 minutes with the pt, 50% time for counseling and education.     Diagnostic Studies   08/20/13 Echo: nl LVSF, borderline LVH, nl LA size, no sig valvular abnormalities.  07/17/13 pacemaker check: 80% A paced, AT/AF burden 0%, < 1% since 09/24/2009.  09/21/11 nuclear stress: no ischemia  Lexiscan  nuclear stress test 08/11/15: a small sized area of  ischemia in the anterior wall, LVEF 75%.   Carotid artery duplex 12/16/15:  Normal R carotid artery, mild dz on the left side.   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     History of Present Illness   83 y/o female with a PMH of hypthyrodism, pAfib, s/p PPM,  insomnia, prolapsed uterus s/p surgery 02/14/16 , and recent ER visit for CP on 08/07/15, s/p Colpectomy 02/07/16, and tinnitus/vertigo, who presents for follow-up.      Pt states that after holding Crestor, the ache in the arm  and shoulders has resolved. She continues to feel tired with DOE,  and reports numbness in the b/l upper arms and legs, and weakness in the legs in the early morning, but it improved over time during the day with exercise. No claudication. Changing Metoprolol tartrate to once a day did not help with the fatigue.  She reports recent worsening of acid reflux pain, which has improved after stopping Cranberry pills. She denies any exertional CP . No SOB at rest. She takes melatonin for sleeping and reports relatively well sleeping quality at night.     Past Medical History     Past Medical History:   Diagnosis Date   . Arrhythmia     Bradycardia   . Atrial fibrillation    . Atrial tachycardia    . Chest pain    . Constipation    . Gastroesophageal reflux disease     on med   . Hyperlipidemia    . Hypothyroidism    . Left arm pain    . Pacemaker    . PNA (pneumonia) 2016    hospitalized over night for pneumonia   . Prolapse of uterus    . SOB (shortness of breath)    . Tinnitus    . Urinary tract infection 12/2015    on med, no symptoms presently   . Ventricular tachycardia    . Vertigo        Past Surgical History     Past Surgical History:   Procedure Laterality Date   . ADENOIDECTOMY      age7   . BACK SURGERY  1980   . CARDIAC CATHETERIZATION  08/16/2015   . COLPECTOMY N/A 02/07/2016    Procedure: COLPECTOMY, TVT SLING;  Surgeon: Selinda Flavin, MD;  Location: [facility];  Service: Gynecology Urology;  Laterality: N/A;   . CYSTOSCOPY N/A 02/07/2016    Procedure: CYSTOSCOPY;  Surgeon: Selinda Flavin, MD;  Location:  [facility];  Service: Gynecology Urology;  Laterality: N/A;   . DENTAL SURGERY      Root canal   . HYSTERECTOMY  2004   . INSERT / REPLACE / REMOVE PACEMAKER  October 2011   . MOHS SURGERY     . TONSILLECTOMY      age 69       Social History     Social History     Social History   . Marital status: Married     Spouse name: N/A   . Number of children: N/A   . Years of education: N/A      Occupational History   . Not on file.     Social History Main Topics   . Smoking status: Never  Smoker   . Smokeless tobacco: Never Used   . Alcohol use 0.6 oz/week     1 Glasses of wine per week      Comment: Glass of wine occasionally   . Drug use: No   . Sexual activity: Not on file     Other Topics Concern   . Not on file     Social History Narrative   . No narrative on file       Family History     Family History   Problem Relation Age of Onset   . Myocardial Infarction Father    . Coronary artery disease Father    . Hyperlipidemia Father    . Hyperlipidemia Sister    . Hypertension Neg Hx        Allergies     Allergies   Allergen Reactions   . Flecainide      Tinnitus,    . Propafenone      dizziness       Medications     Current Outpatient Prescriptions   Medication Sig Dispense Refill   . aspirin 81 MG tablet Take 81 mg by mouth daily.     Marland Kitchen esomeprazole (NEXIUM) 20 MG capsule Take 20 mg by mouth every morning before breakfast.     . levothyroxine (SYNTHROID, LEVOTHROID) 50 MCG tablet Take 50 mcg by mouth every morning.         . metoprolol tartrate (LOPRESSOR) 25 MG tablet Take 0.5 tablets (12.5 mg total) by mouth 2 (two) times daily.With next refill 90 tablet 3   . nitroglycerin (NITROSTAT) 0.4 MG SL tablet Place 1 tablet (0.4 mg total) under the tongue as needed for Chest pain (q86min x3. call MD or 911 if pain persists). 25 tablet 3   . senna-docusate (PERICOLACE) 8.6-50 MG per tablet Take 2 tablets by mouth daily. (Patient taking differently: Take 2 tablets by mouth as needed.    ) 30 tablet 0   . Cholecalciferol (VITAMIN D PO) Take by mouth daily.     . Cyanocobalamin (VITAMIN B 12) 250 MCG Lozenge Take by mouth.       No current facility-administered medications for this visit.        Review of Systems     General:+ fatigue. Not Present-  Weight Gain and Weight Loss.  Respiratory:Not Present- Bloody sputum, Cough, Dyspnea and Wheezing.  Cardiovascular: DOE Not Present- Calf, thigh or buttock pain  with walking, Difficulty Breathing Lying Down, Edema, Awakening Short of Breath   Gastrointestinal:+ heart burn . Not Present- Bloody Stool, Constipation, Diarrhea, Hematemesis, Indigestion, Nausea and Vomiting.  Neurological:Not Present-dizzy, Syncope and Weakness.    Physical Exam     Vitals:    08/31/16 1410   BP: 100/58   Pulse: 60   Weight: 66.7 kg (147 lb)   Height: 1.626 m (5\' 4" )       General:  Patient appears their stated age, well-nourished.  Alert and in no apparent distress.  Eyes: No conjunctivitis, no purulent discharge, no lid lag  ENT:  Hearing grossly intact, Nares patent bilaterally, Lips moist, color appropriate for race.  Respiratory: Clear to auscultation and percussion throughout. Respiratory effort unlabored, chest expansion symmetric.    Cardio: Regular rate and rhythm. Normal S1/S2, no m/r/g.  No carotid bruits or thrills, no JVD.  Extremities: warm, pulses 2+ dp b/l, no peripheral edema; R wrist cath site is clean, no swelling, b/l radial pulses equal and normal.   GI: Soft, nondistended,  nontender.  No guarding or rebound.  Skin: Color appropriate for race, Skin warm, dry, and intact; mild varicose veins in the b/l lower legs.   Psychiatric: Good insight and judgment, oriented to person, place, and time    Labs   08/03/13: TSH 0.2, FT4 2.41  08/09/13: K 4.5, Cr. 0.72, LFT normal, Hg 12.8  08/16/15: Hg 12.1, plt 212, wbc 8.0, BUN 17, Cr. 0.63, Na 139, K 4.2, glucose 208, LFT normal,   04/16/2013: T. Chol 179, Trig 210, HDL 46, LDL 91 mg/dl  1/61/09 : TSH  6.04, FT4 1.83. T. Chol 185, HDL 41, LDL 115, Trig 145  12/03/13: K 4.3, cr. 0.9, LFT normal, TSH 2.5, T4 9.6, UA negative.  01/13/16: Hg  13.2, plt 256, BUN 14, Cr. 0.66, K 5.2, Na 142, LFT normal.  07/25/16: T. Chol 252, Trig 269, LDL  142, HDL 56, BUn 11, Cr. 0.71, glucose 126, Na 143, Ca 10.4, LFT normal. Hg 14.5.   04/18/2016: TSH 2.71.      EKG   ECG 10/20/14: Atrial paced rhythm. No ST/T abnormalities.   ECG 03/25/15: Atrial paced  rhythm, no sig ST/T abnormalities.   ECG 08/12/15: Atrial paced rhythm, no sig ST/T abnormalities.   ECG 09/02/15: Atrial paced rhythm, no sig ST/T abnormalities.  ECG 11/11/15: Atrial paced rhythm, nonspecific T wave abnormalities.   ECG 12/03/15: Atrial paced rhythm,nonspecific T wave abnormalities.   ECG 12/16/15: Atrial paced rhythm, nonspecific T wave abnormalities.  ECG 01/18/16: Atrial paced rhythm at 60 bpm, nonspecific T wave abnormalities, QTc 424 msec.    ECG 02/02/16:  A- paced rhythm, QTc normal.   ECG 03/06/16: A- paced rhythm at 60 bpm. QTc 428 msec.   ECG  07/06/16: A-paced rhythm at 65 bpm.   ECG 08/31/2016: A-paced rhythm at 60 bpm  Follow-Up     Return in about 3 months (around 11/29/2016).    Warmest Regards,    Kelli Hope, MD PhD  [facility]       .

## 2016-09-08 ENCOUNTER — Encounter (INDEPENDENT_AMBULATORY_CARE_PROVIDER_SITE_OTHER): Payer: Medicare Other

## 2016-09-11 ENCOUNTER — Telehealth (INDEPENDENT_AMBULATORY_CARE_PROVIDER_SITE_OTHER): Payer: Self-pay

## 2016-09-11 NOTE — Telephone Encounter (Signed)
Please instruct pt to decrease Metoprolol back to once a day.   Call if symptoms persists (tolerated in the past).     Thanks,  Dr. Dion Body

## 2016-09-11 NOTE — Telephone Encounter (Signed)
Pt called to advise that she began taking metoprolol after lov on 08/31/16. States she is experiencing dizziness and fatigue and muscle aches since taking med.     Please advise. Thank you.

## 2016-09-12 NOTE — Telephone Encounter (Signed)
Pt advised of message below. Expressed understanding.

## 2016-09-12 NOTE — Telephone Encounter (Signed)
lmtcb

## 2016-09-18 ENCOUNTER — Ambulatory Visit (INDEPENDENT_AMBULATORY_CARE_PROVIDER_SITE_OTHER): Payer: Medicare Other | Admitting: Cardiovascular Disease

## 2016-09-22 ENCOUNTER — Telehealth (INDEPENDENT_AMBULATORY_CARE_PROVIDER_SITE_OTHER): Payer: Self-pay

## 2016-09-22 NOTE — Telephone Encounter (Signed)
Pt called stating that she has been having neck and arm pain for the past two weeks. Pt states that she consulted with PCP yesterday, 01/25, regarding symptoms and PCP advised it was psychological. Pt states that she does not feel this was a correct assessment. Pt informs that she feels "off" like she may have had a stroke. Pt was advised to have someone transport her to ER for assessment. Expressed understanding.

## 2016-10-06 ENCOUNTER — Ambulatory Visit (INDEPENDENT_AMBULATORY_CARE_PROVIDER_SITE_OTHER): Payer: Medicare Other | Admitting: Cardiovascular Disease

## 2016-12-12 ENCOUNTER — Encounter (INDEPENDENT_AMBULATORY_CARE_PROVIDER_SITE_OTHER): Payer: Medicare Other

## 2017-11-10 ENCOUNTER — Other Ambulatory Visit (INDEPENDENT_AMBULATORY_CARE_PROVIDER_SITE_OTHER): Payer: Self-pay | Admitting: Cardiovascular Disease

## 2017-11-10 DIAGNOSIS — K219 Gastro-esophageal reflux disease without esophagitis: Secondary | ICD-10-CM

## 2019-03-26 ENCOUNTER — Other Ambulatory Visit: Payer: Self-pay | Admitting: Physician Assistant

## 2019-03-26 DIAGNOSIS — M8580 Other specified disorders of bone density and structure, unspecified site: Secondary | ICD-10-CM

## 2019-03-31 ENCOUNTER — Other Ambulatory Visit: Payer: Self-pay | Admitting: Physician Assistant

## 2019-03-31 ENCOUNTER — Ambulatory Visit
Admission: RE | Admit: 2019-03-31 | Discharge: 2019-03-31 | Disposition: A | Discharge status: Home / Self Care | Payer: Medicare Other | Source: Ambulatory Visit | Attending: Physician Assistant | Admitting: Physician Assistant

## 2019-03-31 DIAGNOSIS — M8580 Other specified disorders of bone density and structure, unspecified site: Secondary | ICD-10-CM

## 2019-03-31 DIAGNOSIS — Z13820 Encounter for screening for osteoporosis: Secondary | ICD-10-CM

## 2019-05-19 ENCOUNTER — Ambulatory Visit (FREE_STANDING_LABORATORY_FACILITY): Payer: Medicare Other | Admitting: Family Medicine

## 2019-05-19 ENCOUNTER — Encounter (INDEPENDENT_AMBULATORY_CARE_PROVIDER_SITE_OTHER): Payer: Self-pay

## 2019-05-19 VITALS — BP 143/84 | HR 82 | Temp 98.0°F | Resp 17 | Wt 144.0 lb

## 2019-05-19 DIAGNOSIS — N3000 Acute cystitis without hematuria: Secondary | ICD-10-CM

## 2019-05-19 LAB — POCT URINALYSIS AUTOMATED (IAH)
Bilirubin, UA POCT: NEGATIVE
Glucose, UA POCT: NEGATIVE
Ketones, UA POCT: NEGATIVE mg/dL
Nitrite, UA POCT: NEGATIVE
PH, UA POCT: 7 (ref 4.6–8)
Protein, UA POCT: 30 mg/dL — AB
Specific Gravity, UA POCT: 1.025 mg/dL (ref 1.001–1.035)
Urobilinogen, UA POCT: 0.2 mg/dL

## 2019-05-19 MED ORDER — LEVOFLOXACIN 500 MG PO TABS
500.00 mg | ORAL_TABLET | Freq: Every day | ORAL | 0 refills | Status: AC
Start: 2019-05-19 — End: 2019-05-26

## 2019-05-19 NOTE — Progress Notes (Signed)
[facility]  PROGRESS NOTE     Patient: Margaret Shea   Date of Service: 05/19/2019   MRN: [id number]       Ciella Obi is a 85 y.o. female      HISTORY     Chief Complaint   Patient presents with   . Urinary Tract Infection Symptoms     Pt c/o uti smyptoms for a couple of days. Pt symptoms include urgency in urinating, burning, and no energy. Pt confirmed just drinking water with no relief.         HPI    85 year old female is presenting with dysuria, urgency and feeling fatigue in the past couple of days.  She has been drinking plenty of water without any relief.  Denies fever, chills, flank pain, nausea and vomiting.  No pelvic pain or any GYN issues.    Review of Systems   Constitutional: Negative for chills, diaphoresis and fever.   HENT: Negative for congestion, rhinorrhea, sneezing and sore throat.         No recent illness     Respiratory: Negative for cough and shortness of breath.    Cardiovascular: Negative for chest pain.   Gastrointestinal: Negative for abdominal distention, abdominal pain (mild suprapubic pain), constipation, diarrhea, nausea and vomiting.   Genitourinary: Positive for dysuria, frequency and urgency. Negative for flank pain, hematuria, menstrual problem, pelvic pain, vaginal bleeding and vaginal discharge.   Musculoskeletal: Negative for arthralgias, back pain (no CVA tenderness) and myalgias.   Skin: Negative for pallor and rash.   Neurological: Negative for dizziness and headaches.   Hematological: Negative for adenopathy.   Psychiatric/Behavioral: Negative for confusion.   All other systems reviewed and are negative.      History:  Past Medical History:   Diagnosis Date   . Arrhythmia     Bradycardia   . Atrial fibrillation    . Atrial tachycardia    . Chest pain    . Constipation    . Gastroesophageal reflux disease     on med   . Hyperlipidemia    . Hypothyroidism    . Left arm pain    . Pacemaker    . PNA (pneumonia) 2016    hospitalized over night for pneumonia    . Prolapse of uterus    . SOB (shortness of breath)    . Tinnitus    . Urinary tract infection 12/2015    on med, no symptoms presently   . Ventricular tachycardia    . Vertigo        Past Surgical History:   Procedure Laterality Date   . ADENOIDECTOMY      age7   . BACK SURGERY  1980   . CARDIAC CATHETERIZATION  08/16/2015   . COLPECTOMY N/A 02/07/2016    Procedure: COLPECTOMY, TVT SLING;  Surgeon: Selinda Flavin, MD;  Location: [facility] OR;  Service: Gynecology Urology;  Laterality: N/A;   . CYSTOSCOPY N/A 02/07/2016    Procedure: CYSTOSCOPY;  Surgeon: Selinda Flavin, MD;  Location: [facility];  Service: Gynecology Urology;  Laterality: N/A;   . DENTAL SURGERY      Root canal   . HYSTERECTOMY  2004   . INSERT / REPLACE / REMOVE PACEMAKER  October 2011   . MOHS SURGERY     . TONSILLECTOMY      age 20       Family History   Problem Relation Age of Onset   .  Myocardial Infarction Father    . Coronary artery disease Father    . Hyperlipidemia Father    . Hyperlipidemia Sister    . Hypertension Neg Hx        Social History     Tobacco Use   . Smoking status: Never Smoker   . Smokeless tobacco: Never Used   Substance Use Topics   . Alcohol use: Yes     Alcohol/week: 1.0 standard drinks     Types: 1 Glasses of wine per week     Comment: Glass of wine occasionally   . Drug use: No       History reviewed.        Current Outpatient Medications:   .  aspirin 81 MG tablet, Take 81 mg by mouth daily., Disp: , Rfl:   .  Cholecalciferol (VITAMIN D PO), Take by mouth daily., Disp: , Rfl:   .  Cyanocobalamin (VITAMIN B 12) 250 MCG Lozenge, Take by mouth., Disp: , Rfl:   .  escitalopram (LEXAPRO) 10 MG tablet, Take 10 mg by mouth daily, Disp: , Rfl:   .  famotidine (PEPCID) 20 MG tablet, TAKE ONE TABLET BY MOUTH TWICE A DAY, Disp: 180 tablet, Rfl: 3  .  fludrocortisone (FLORINEF) 0.1 MG tablet, Take 0.1 mg by mouth daily, Disp: , Rfl:   .  levothyroxine (SYNTHROID, LEVOTHROID) 50 MCG tablet, Take 50 mcg by mouth every  morning.  , Disp: , Rfl:   .  metoprolol tartrate (LOPRESSOR) 25 MG tablet, Take 0.5 tablets (12.5 mg total) by mouth 2 (two) times daily.With next refill, Disp: 90 tablet, Rfl: 3  .  nitroglycerin (NITROSTAT) 0.4 MG SL tablet, Place 1 tablet (0.4 mg total) under the tongue as needed for Chest pain (q40min x3. call MD or 911 if pain persists)., Disp: 25 tablet, Rfl: 3  .  rosuvastatin (CRESTOR) 20 MG tablet, Take 20 mg by mouth nightly, Disp: , Rfl:   .  vitamin B-12 (CYANOCOBALAMIN) 500 MCG tablet, Take 2,500 mcg by mouth daily, Disp: , Rfl:   .  warfarin (COUMADIN) 4 MG tablet, Take 4 mg by mouth, Disp: , Rfl:   .  esomeprazole (NEXIUM) 20 MG capsule, Take 20 mg by mouth every morning before breakfast., Disp: , Rfl:   .  levoFLOXacin (LEVAQUIN) 500 MG tablet, Take 1 tablet (500 mg total) by mouth daily for 7 days, Disp: 7 tablet, Rfl: 0  .  senna-docusate (PERICOLACE) 8.6-50 MG per tablet, Take 2 tablets by mouth daily. (Patient taking differently: Take 2 tablets by mouth as needed.  ), Disp: 30 tablet, Rfl: 0    Allergies   Allergen Reactions   . Flecainide      Tinnitus,    . Propafenone      dizziness       Medications and Allergies reviewed.    PHYSICAL EXAM     Vitals:    05/19/19 1601   BP: 143/84   Pulse: 82   Resp: 17   Temp: 98 F (36.7 C)   TempSrc: Tympanic   SpO2: 98%   Weight: 65.3 kg (144 lb)       Physical Exam   Constitutional: She is oriented to person, place, and time. She appears well-developed and well-nourished. No distress.   HENT:   Head: Normocephalic and atraumatic.   Eyes: Conjunctivae are normal. No scleral icterus.   Pulmonary/Chest: Effort normal.   Abdominal: Soft. There is no abdominal tenderness. There is no guarding.  There is mild suprapubic tenderness. No CVA tenderness.   Neurological: She is alert and oriented to person, place, and time.   Skin: Skin is warm and dry.   Psychiatric: She has a normal mood and affect. Her behavior is normal.         UCC COURSE       Results      Procedure Component Value Units Date/Time    Urine culture (IL/LC/QU) [[id number]] Collected: 05/19/19 1856    Specimen: Urine, Clean Catch Updated: 05/20/19 0337    UA Clinitek (urine dipstick) [[id number]]  (Abnormal) Collected: 05/19/19 1610     Updated: 05/19/19 1614     Urine Color POCT Yellow     Urine Clarity POCT Clear     Glucose, UA POCT Negative     Bilirubin, UA POCT Negative     Ketones, UA POCT Negative mg/dL      Specific Gravity, UA POCT 1.025 mg/dL      Blood, UA POCT  Moderate     PH, UA POCT 7.0     Protein, UA POCT =30 mg/dL      Urobilinogen, UA POCT 0.2 mg/dL      Nitrite, UA POCT Negative     Urine Leukocytes POCT Large            No results found.      Orders Placed This Encounter   Medications   . levoFLOXacin (LEVAQUIN) 500 MG tablet     Sig: Take 1 tablet (500 mg total) by mouth daily for 7 days     Dispense:  7 tablet     Refill:  0         PROCEDURES     Procedures       ASSESSMENT     Encounter Diagnosis   Name Primary?   . Acute cystitis without hematuria Yes          SSESSMENT    PLAN     1. Drink plenty of fluids  2. Urine culture is pending  3. Take your antibiotic  4. Follow up with your doctor in 2-3 days if not better   5. Must check your PT / INR with your doctor in 4-5 days.    Discussed results and diagnosis with patient/family.  Reviewed warning signs for worsening condition, as well as, indications for follow-up with pmd and return to urgent care clinic.   Patient/family expressed understanding of instructions.    Orders Placed This Encounter   Procedures   . Urine culture (IL/LC/QU)   . UA Clinitek (urine dipstick)         An After Visit Summary was printed and given to the patient.      Signed,  Loura Halt, MD

## 2019-05-19 NOTE — Patient Instructions (Signed)
Bladder Infection,Female (Adult)    Urine normally doesn't have any germs (bacteria) in it. But bacteria can get into the urinary tract from the skin around the rectum. Or they can travel in the blood from other parts of the body. Once they are in your urinary tract, they can cause infection in these areas:   The urethra (urethritis)   The bladder (cystitis)   The kidneys (pyelonephritis)  The most common place for an infection is in the bladder. This is called a bladder infection. This is one of the most common infections in women. Most bladder infections are easily treated. They are not serious unless the infection spreads to the kidney.  The terms bladder infection, UTI, and cystitis are often used to describe the same thing. But they are not always the same. Cystitis is an inflammation of the bladder. Themost common cause of cystitis is an infection.  Symptoms  The infection causes inflammation in the urethra and bladder. This causes many of the symptoms. The most common symptoms of a bladder infection are:   Pain or burning when urinating   Having to urinate more often than normal   Urgent need to urinate   Only a small amount of urine comes out   Blood in urine   Belly (abdominal) discomfort. This is often in the lower belly above the pubic bone.   Cloudy urine   Strong- or bad-smelling urine   Unable to urinate (urinary retention)   Unable to hold urine in (urinary incontinence)   Fever   Loss of appetite   Confusion (in older adults)  Causes  Bladder infections are not contagious. You can't get one from someone else, from a toilet seat, or from sharing a bath.  The most common cause of bladder infections is bacteria from the bowels. The bacteria get onto the skin around the opening of the urethra. From there, they can get into the urine. Then they travel up to the bladder, causing inflammation and infection. This often happens because of:   Wiping incorrectly after urinating. Always  wipe from front to back.   Bowel incontinence   Pregnancy   Procedures such as having a catheter put in   Older age   Not emptying your bladder. This can give bacteria a chance to grow in your urine.   Fluid loss (dehydration)   Constipation   Having sex   Using a diaphragm for birth control  Treatment  Bladder infections are diagnosed by a urine test and urine culture. They are treated with antibiotics. They oftenclear up quickly without problems. Treatment helps prevent a more serious kidney infection.  Medicines  Medicines can help in the treatment of a bladder infection:   Take antibiotics until they are used up, even if you feel better. It's important to finish them to make sure the infection has cleared.   You can use acetaminophen or ibuprofen for pain, fever, or discomfort, unless another medicine was prescribed. If you have long-term (chronic) liver or kidney disease, talk with your healthcareprovider before usingthese medicines. Also talk with your provider if you've ever had a stomach ulcer or GI (gastrointestinal) bleeding, or are taking blood-thinner medicines.   If you are givenphenazopydridine to reduce burning with urination, it will make your urine a bright orange color. This can stain clothing.  Care and prevention  These self-care steps can help prevent future infections:   Drink plenty of fluids. This helps to prevent dehydration and flush out your bladder. Do thisunless   you must restrict fluids for other health reasons, or your healthcare provider told you not to.   Clean yourself correctly after going to the bathroom. Wipe from front to back after using the toilet. This helps prevent the spread of bacteria.   Urinate more often. Don't try to hold urine in for a long time.   Wear loose-fitting clothes and cotton underwear. Don't wear tight-fitting pants.   Improve your diet and prevent constipation. Eat more fresh fruits and vegetables, andfiber. Eat less junk foods and  fatty foods.   Don't have sex until your symptoms are gone.   Don't have caffeine, alcohol, and spicy foods. These can irritate your bladder.   Urinate right after you have sex to flush out your bladder.   If you use birth control pills and have frequent bladder infections, discuss it with your healthcare provider.  Follow-up care  Call your healthcare provider if all symptoms are not gone after 3 days of treatment. This is especially important if you have repeat infections.  If a culture was done, you will be told if your treatment needs to be changed. If directed, you can callto find out the results.  If X-rays were done, you will be told if the results will affect yourtreatment.  Call 911  Call 911 if any of the following occur:   Trouble breathing   Hard to wake up orconfusion   Fainting (loss of consciousness)   Fast heart rate  When to get medical advice  Call your healthcare provider right away if any of these occur:   Fever of 100.4F (38.0C) or higher, or as directed by your healthcare provider   Symptoms are not betterafter 3 days of treatment   Back or belly pain that gets worse   Repeated vomiting, or unable to keep medicine down   Weakness or dizziness   Vaginal discharge   Pain, redness, or swelling in the outer vaginal area (labia)  [employer] last reviewed this educational content on 06/28/2018   2000-2020 [employer] [address], [place] [address]. All rights reserved. This information is not intended as a substitute for professional medical care. Always follow your healthcare professional's instructions.

## 2019-10-21 ENCOUNTER — Emergency Department (HOSPITAL_COMMUNITY): Payer: Medicare Other

## 2019-10-21 ENCOUNTER — Encounter (HOSPITAL_COMMUNITY): Payer: Self-pay | Admitting: Emergency Medicine

## 2019-10-21 ENCOUNTER — Other Ambulatory Visit: Payer: Self-pay

## 2019-10-21 ENCOUNTER — Emergency Department (HOSPITAL_COMMUNITY)
Admission: EM | Admit: 2019-10-21 | Discharge: 2019-10-22 | Disposition: A | Discharge status: Home / Self Care | Payer: Medicare Other | Attending: Emergency Medicine | Admitting: Emergency Medicine

## 2019-10-21 DIAGNOSIS — R531 Weakness: Secondary | ICD-10-CM

## 2019-10-21 DIAGNOSIS — R42 Dizziness and giddiness: Secondary | ICD-10-CM | POA: Diagnosis not present

## 2019-10-21 DIAGNOSIS — N390 Urinary tract infection, site not specified: Secondary | ICD-10-CM | POA: Diagnosis not present

## 2019-10-21 DIAGNOSIS — I4891 Unspecified atrial fibrillation: Secondary | ICD-10-CM | POA: Insufficient documentation

## 2019-10-21 DIAGNOSIS — Z7901 Long term (current) use of anticoagulants: Secondary | ICD-10-CM | POA: Diagnosis not present

## 2019-10-21 DIAGNOSIS — R399 Unspecified symptoms and signs involving the genitourinary system: Secondary | ICD-10-CM

## 2019-10-21 DIAGNOSIS — R4182 Altered mental status, unspecified: Secondary | ICD-10-CM | POA: Diagnosis present

## 2019-10-21 HISTORY — DX: Disorder of thyroid, unspecified: E07.9

## 2019-10-21 LAB — CBC WITH DIFFERENTIAL/PLATELET
Abs Immature Granulocytes: 0.04 10*3/uL (ref 0.00–0.07)
Basophils Absolute: 0.1 10*3/uL (ref 0.0–0.1)
Basophils Relative: 1 %
Eosinophils Absolute: 0.2 10*3/uL (ref 0.0–0.5)
Eosinophils Relative: 2 %
HCT: 38.3 % (ref 36.0–46.0)
Hemoglobin: 12.6 g/dL (ref 12.0–15.0)
Immature Granulocytes: 1 %
Lymphocytes Relative: 14 %
Lymphs Abs: 1.2 10*3/uL (ref 0.7–4.0)
MCH: 30.1 pg (ref 26.0–34.0)
MCHC: 32.9 g/dL (ref 30.0–36.0)
MCV: 91.6 fL (ref 80.0–100.0)
Monocytes Absolute: 0.6 10*3/uL (ref 0.1–1.0)
Monocytes Relative: 7 %
Neutro Abs: 6.7 10*3/uL (ref 1.7–7.7)
Neutrophils Relative %: 75 %
Platelets: 180 10*3/uL (ref 150–400)
RBC: 4.18 MIL/uL (ref 3.87–5.11)
RDW: 13.1 % (ref 11.5–15.5)
WBC: 8.7 10*3/uL (ref 4.0–10.5)
nRBC: 0 % (ref 0.0–0.2)

## 2019-10-21 LAB — COMPREHENSIVE METABOLIC PANEL
ALT: 17 U/L (ref 0–44)
AST: 18 U/L (ref 15–41)
Albumin: 3.8 g/dL (ref 3.5–5.0)
Alkaline Phosphatase: 64 U/L (ref 38–126)
Anion gap: 9 (ref 5–15)
BUN: 11 mg/dL (ref 8–23)
CO2: 26 mmol/L (ref 22–32)
Calcium: 8.6 mg/dL — ABNORMAL LOW (ref 8.9–10.3)
Chloride: 99 mmol/L (ref 98–111)
Creatinine, Ser: 0.79 mg/dL (ref 0.44–1.00)
GFR calc Af Amer: >60 mL/min (ref >60)
GFR calc non Af Amer: >60 mL/min (ref >60)
Glucose, Bld: 231 mg/dL — ABNORMAL HIGH (ref 70–99)
Potassium: 3.4 mmol/L — ABNORMAL LOW (ref 3.5–5.1)
Sodium: 134 mmol/L — ABNORMAL LOW (ref 135–145)
Total Bilirubin: 1 mg/dL (ref 0.3–1.2)
Total Protein: 6.8 g/dL (ref 6.5–8.1)

## 2019-10-21 LAB — PROTIME-INR
INR: 2.1 — ABNORMAL HIGH (ref 0.8–1.2)
Prothrombin Time: 23.6 seconds — ABNORMAL HIGH (ref 11.4–15.2)

## 2019-10-21 LAB — URINALYSIS, ROUTINE W REFLEX MICROSCOPIC
Bacteria, UA: NONE SEEN
Bilirubin Urine: NEGATIVE
Glucose, UA: 150 mg/dL — AB
Ketones, ur: NEGATIVE mg/dL
Leukocytes,Ua: NEGATIVE
Nitrite: NEGATIVE
Protein, ur: NEGATIVE mg/dL
Specific Gravity, Urine: 1.006 (ref 1.005–1.030)
pH: 7 (ref 5.0–8.0)

## 2019-10-21 LAB — LIPASE, BLOOD: Lipase: 23 U/L (ref 11–51)

## 2019-10-21 MED ORDER — SODIUM CHLORIDE 0.9 % IV BOLUS
500.0000 mL | Freq: Once | INTRAVENOUS | Status: AC
  Administered 2019-10-21: 19:00:00 500 mL via INTRAVENOUS

## 2019-10-21 NOTE — Discharge Instructions (Signed)
Please take all of your antibiotics until finished!   Take your antibiotics with food.  Common side effects of antibiotics include nausea, vomiting, abdominal discomfort, and diarrhea. You may help offset some of this with probiotics which you can buy or get in yogurt. Do not eat  or take the probiotics until 2 hours after your antibiotic.    Drink plenty of fluids and get rest.  Be careful when you get up to stand or as you get around. It may be helpful to invest in a cane or a walker to help you get around while you are waiting for follow-up with the PCP in 2 days.  Follow-up with the PCP in 2 days as scheduled.  Return to the emergency department if any concerning signs or symptoms develop such as fevers, vomiting, repeat fall, loss of consciousness.

## 2019-10-21 NOTE — ED Triage Notes (Signed)
Patient arrived by EMS from home. [facility] was [facility]   Patient is having AMS per EMS.

## 2019-10-21 NOTE — ED Provider Notes (Signed)
[facility]-EMERGENCY DEPT Provider Note   CSN: [id number] Arrival date & time: 10/21/19  1816     History Chief Complaint  Patient presents with   Altered Mental Status    Jessica Strickland is a 86 y.o. female with history of GERD, thyroid disorder, seasonal allergies, hyperlipidemia presents for evaluation of progressively worsening generalized weakness, confusion, urinary symptoms for several days.  Patient reports dysuria and urinary urgency and frequency.  She was reportedly at an urgent care yesterday and started on Keflex for management of UTI and has had 2 doses so far.  She denies fevers, chest pain, shortness of breath, cough, or abdominal pain.  Denies nausea, vomiting, or diarrhea.  Today in the afternoon she states that she could not get out of bed and felt too weak to stand or ambulate so she called out to family to help her up and EMS was subsequently called.  7:18 PM Spoke with patient's daughter on the phone with patient's permission.  She states that she is on warfarin for paroxysmal A. fib, has a pacemaker.  She notes that the patient has been fatigued over the last several days, sleeping more and more.  She has been experiencing depression after the loss of her husband earlier last year and recently had her Lexapro dose increased from 10 mg to 15 mg on 10/16/2019.  Yesterday the patient indicated to her that she was experiencing some dysuria, urinary frequency and a sensation that she is having difficulty emptying her bladder so [facility] which showed [facility]uria and she was started on Keflex 500 mg for treatment of UTI of which she has had 2 doses.  Today while the patient's daughter was at work the patient apparently called out to her grandson to help her.  When the grandson came into the patient's room he noted that she was on the floor, laying on all fours and could not stand up.  He assisted her in standing and helped her into the  bed.  To their knowledge she did not sustain any head injury or loss of consciousness but it appeared that she was attempting to ambulate after getting up out of the bed and was feeling weak and so fell to the ground.  Also of note the patient has been vaccinated for Covid, second dose was administered on 10/08/2019.  The patient is currently staying with her daughter and has been for the last 7 weeks; her permanent residence is in [place].  The patient's daughter made her an appointment with a PCP for Thursday.   The history is provided by the patient.       Past Medical History:  Diagnosis Date   Thyroid disease     There are no problems to display for this patient.   History reviewed. No pertinent surgical history.   OB History   No obstetric history on file.     History reviewed. No pertinent family history.  Social History   Tobacco Use   Smoking status: Unknown If Ever Smoked  Substance Use Topics   Alcohol use: Not Currently   Drug use: Not Currently    Home Medications Prior to Admission medications   Medication Sig Start Date End Date Taking? Authorizing Provider  aspirin 325 MG EC tablet Take 325 mg by mouth daily.   Yes [provider]  Calcium Carb-Cholecalciferol (CALCIUM 1000 + D PO) Take 1 tablet by mouth daily.   Yes [provider]  cholecalciferol (VITAMIN D3) 25 MCG (1000 UNIT) tablet Take 2,000 Units by mouth daily.   Yes [provider]  Cyanocobalamin (VITAMIN B-12) 2500 MCG SUBL Place 1 tablet under the tongue daily.   Yes [provider]  escitalopram (LEXAPRO) 5 MG tablet Take 5 mg by mouth daily.   Yes [provider]  famotidine (PEPCID) 20 MG tablet Take 20 mg by mouth 2 (two) times daily.   Yes [provider]  Ferrous Sulfate (IRON PO) Take 1 tablet by mouth daily.   Yes [provider]  fludrocortisone (FLORINEF) 0.1 MG tablet Take 0.1 mg by mouth daily.   Yes [provider]  levothyroxine (SYNTHROID) 50 MCG tablet Take 50 mcg by mouth daily before breakfast.   Yes [provider]  loratadine (ALLERGY) 10 MG tablet Take 10 mg by mouth daily.   Yes [provider]  nitroGLYCERIN (NITROSTAT) 0.4 MG SL tablet Place 0.4 mg under the tongue every 5 (five) minutes as needed for chest pain.   Yes [provider]  rosuvastatin (CRESTOR) 20 MG tablet Take 20 mg by mouth daily.   Yes [provider]  warfarin (COUMADIN) 4 MG tablet Take 4 mg by mouth daily.   Yes [provider]    Allergies    Patient has no known allergies.  Review of Systems   Review of Systems  Constitutional: Positive for fatigue. Negative for chills and fever.  Respiratory: Negative for cough and shortness of breath.   Cardiovascular: Negative for chest pain.  Gastrointestinal: Negative for abdominal pain, diarrhea, nausea and vomiting.  Genitourinary: Positive for dysuria, frequency and urgency. Negative for hematuria.  Neurological: Positive for weakness (Generalized) and light-headedness.  Psychiatric/Behavioral: Positive for confusion.  All other systems reviewed and are negative.   Physical Exam Updated Vital Signs BP (!) 138/57    Pulse 69    Temp 99.7 F (37.6 C) (Oral)    Resp 19    Wt 65.8 kg    SpO2 95%   Physical Exam Vitals and nursing note reviewed.  Constitutional:      General: She is not in acute distress.    Appearance: She is well-developed.  HENT:     Head: Normocephalic and atraumatic.  Eyes:     General:        Right eye: No discharge.        Left eye: No discharge.     Conjunctiva/sclera: Conjunctivae normal.     Pupils: Pupils are equal, round, and reactive to light.  Neck:     Vascular: No JVD.     Trachea: No tracheal deviation.  Cardiovascular:     Rate and Rhythm: Normal rate and regular rhythm.     Pulses: Normal pulses.  Pulmonary:     Effort: Pulmonary effort is normal.     Breath  sounds: Normal breath sounds.  Abdominal:     General: Bowel sounds are normal. There is no distension.     Palpations: Abdomen is soft.     Tenderness: There is abdominal tenderness. There is no guarding or rebound.     Comments: Suprapubic tenderness  Musculoskeletal:     Cervical back: Neck supple.  Skin:    General: Skin is warm and dry.     Findings: No erythema.  Neurological:     General: No focal deficit present.     Mental Status: She is alert and oriented to person, place, and time.     Coordination: Abnormal  coordination:      Comments: Mental Status:  Alert, thought content appropriate, able to give a coherent history. Speech fluent without evidence of aphasia. Able to follow 2 step commands without difficulty.  Cranial Nerves:  II:  Peripheral visual fields grossly normal, pupils equal, round, reactive to light III,IV, VI: ptosis not present, extra-ocular motions intact bilaterally  V,VII: smile symmetric, facial light touch sensation equal VIII: hearing grossly normal to voice  X: uvula elevates symmetrically  XI: bilateral shoulder shrug symmetric and strong XII: midline tongue extension without fassiculations Motor:  Normal tone. 5/5 strength of BUE and BLE major muscle groups including strong and equal grip strength and dorsiflexion/plantar flexion Sensory: light touch normal in all extremities. Gait: Slightly shuffling gait, but ambulates steadily and can pull herself up to a standing position easily.  No truncal ataxia  Psychiatric:        Behavior: Behavior normal.     ED Results / Procedures / Treatments   Labs (all labs ordered are listed, but only abnormal results are displayed) Labs Reviewed  URINE CULTURE - Abnormal; Notable for the following components:      Result Value   Culture   (*)    Value: <10,000 COLONIES/mL INSIGNIFICANT GROWTH Performed at [facility], [address] [place], [place] [address]    All other components within  normal limits  COMPREHENSIVE METABOLIC PANEL - Abnormal; Notable for the following components:   Sodium 134 (*)    Potassium 3.4 (*)    Glucose, Bld 231 (*)    Calcium 8.6 (*)    All other components within normal limits  URINALYSIS, ROUTINE W REFLEX MICROSCOPIC - Abnormal; Notable for the following components:   Color, Urine STRAW (*)    Glucose, UA 150 (*)    Hgb urine dipstick MODERATE (*)    All other components within normal limits  PROTIME-INR - Abnormal; Notable for the following components:   Prothrombin Time 23.6 (*)    INR 2.1 (*)    All other components within normal limits  CBC WITH DIFFERENTIAL/PLATELET  LIPASE, BLOOD    EKG None  Radiology CT Head Wo Contrast  Result Date: 10/21/2019 CLINICAL DATA:  Urinary tract infection, altered level of consciousness EXAM: CT HEAD WITHOUT CONTRAST TECHNIQUE: Contiguous axial images were obtained from the base of the skull through the vertex without intravenous contrast. COMPARISON:  None. FINDINGS: Brain: Hypodensities within the periventricular white matter are consistent with age-indeterminate small vessel ischemic changes, favor chronic. No other signs of acute infarct or hemorrhage. Lateral ventricles and remaining midline structures are unremarkable. There are no acute extra-axial fluid collections. No mass effect. Vascular: No hyperdense vessel or unexpected calcification. Skull: Normal. Negative for fracture or focal lesion. Sinuses/Orbits: No acute finding. Other: None IMPRESSION: 1. Age-indeterminate small-vessel ischemic changes in the periventricular white matter, favor chronic. 2. Otherwise no acute intracranial process. Electronically Signed   By: Sharlet Salina M.D.   On: 10/21/2019 20:17   DG Chest Portable 1 View  Result Date: 10/21/2019 CLINICAL DATA:  86 year old female with weakness. EXAM: PORTABLE CHEST 1 VIEW COMPARISON:  None. FINDINGS: There is mild chronic interstitial coarsening. Left lung base density, likely  atelectasis. Developing infiltrate is not excluded. Clinical correlation is recommended. There is no pleural effusion or pneumothorax. Left pectoral pacemaker device. Atherosclerotic calcification of the aorta. No acute osseous pathology. IMPRESSION: Left lung base atelectasis versus less likely developing infiltrate. Clinical correlation is recommended. Electronically Signed   By: Elgie Collard  M.D.   On: 10/21/2019 20:07    Procedures Procedures (including critical care time)  Medications Ordered in ED Medications  sodium chloride 0.9 % bolus 500 mL (0 mLs Intravenous Stopped 10/21/19 2134)    ED Course  I have reviewed the triage vital signs and the nursing notes.  Pertinent labs & imaging results that were available during my care of the patient were reviewed by me and considered in my medical decision making (see chart for details).    MDM Rules/Calculators/A&P                      Patient presenting for evaluation of generalized weakness, possible altered mental status, and urinary symptoms.  She is afebrile, vital signs are stable.  She is nontoxic in appearance.  She is alert and oriented with no focal neurologic deficits on examination today.  She is ambulatory with steady gait and balance.  Per additional history from daughter, the patient reportedly felt unsteady after getting out of bed and lowered herself to the ground briefly, then called out for family.  No head injury or loss of consciousness.  She has had a similar presentation years ago for a UTI per the daughter.  Lab work reviewed and interpreted by myself shows no leukocytosis, no anemia, no metabolic derangements.  She is hyperglycemic but does not appear to be in DKA.  UA shows moderate hemoglobin but is not obviously infected however she has already started treatment for UTI.  Her abdomen is soft, mild discomfort on palpation of the suprapubic region but no rebound or guarding.  Head CT shows no acute intracranial  abnormalities however age-indeterminate small vessel ischemic changes in the periventricular white matter which is favored to be chronic.  At this time given her nonfocal neurologic examination, I doubt acute CVA.  Her chest x-ray shows left lung base atelectasis versus developing infiltrate.  However in the absence of fever, leukocytosis, cough, shortness of breath, tachypnea or hypoxia I have a low suspicion of pneumonia at this time.  She is not orthostatic.  Clinically appears mildly dehydrated.  She was given a small fluid bolus and on reevaluation reports that she is feeling better.  She was reambulated with steady gait and balance.  She has an appointment to establish care with a PCP in 2 days.  I think it is reasonable to complete her course of antibiotics while waiting for urine culture.  Discussed the importance of close follow-up with PCP.  Discussed strict ED return precautions with patient and her daughter on the phone.  Patient and daughter verbalized understanding of and agreement with plan and is safe for discharge home at this time.  Discussed with Dr. Lynelle Doctor who reviewed patient's work-up and results and agrees with assessment and plan at this time.  Final Clinical Impression(s) / ED Diagnoses Final diagnoses:  Generalized weakness  UTI symptoms    Rx / DC Orders ED Discharge Orders    None       Bennye Alm 10/24/19 1117    Linwood Dibbles, MD 10/25/19 0830

## 2019-10-21 NOTE — ED Notes (Signed)
Patient given turkey sandwich and water

## 2019-10-23 LAB — URINE CULTURE: Culture: <10000 — AB

## 2020-04-23 ENCOUNTER — Encounter (FREE_STANDING_LABORATORY_FACILITY): Payer: Medicare Other

## 2020-04-23 DIAGNOSIS — I4891 Unspecified atrial fibrillation: Secondary | ICD-10-CM

## 2020-04-23 LAB — PT/INR
PT INR: 1.2 — ABNORMAL HIGH (ref 0.9–1.1)
PT: 14.1 s — ABNORMAL HIGH (ref 10.1–12.9)

## 2020-05-12 IMAGING — DX DG CHEST 1V PORT
1 series · 1 of 1 positions shown · non-contrast
Comparison: None.

CLINICAL DATA: 87-year-old female with weakness.

EXAM:
PORTABLE CHEST 1 VIEW

[chest ap]
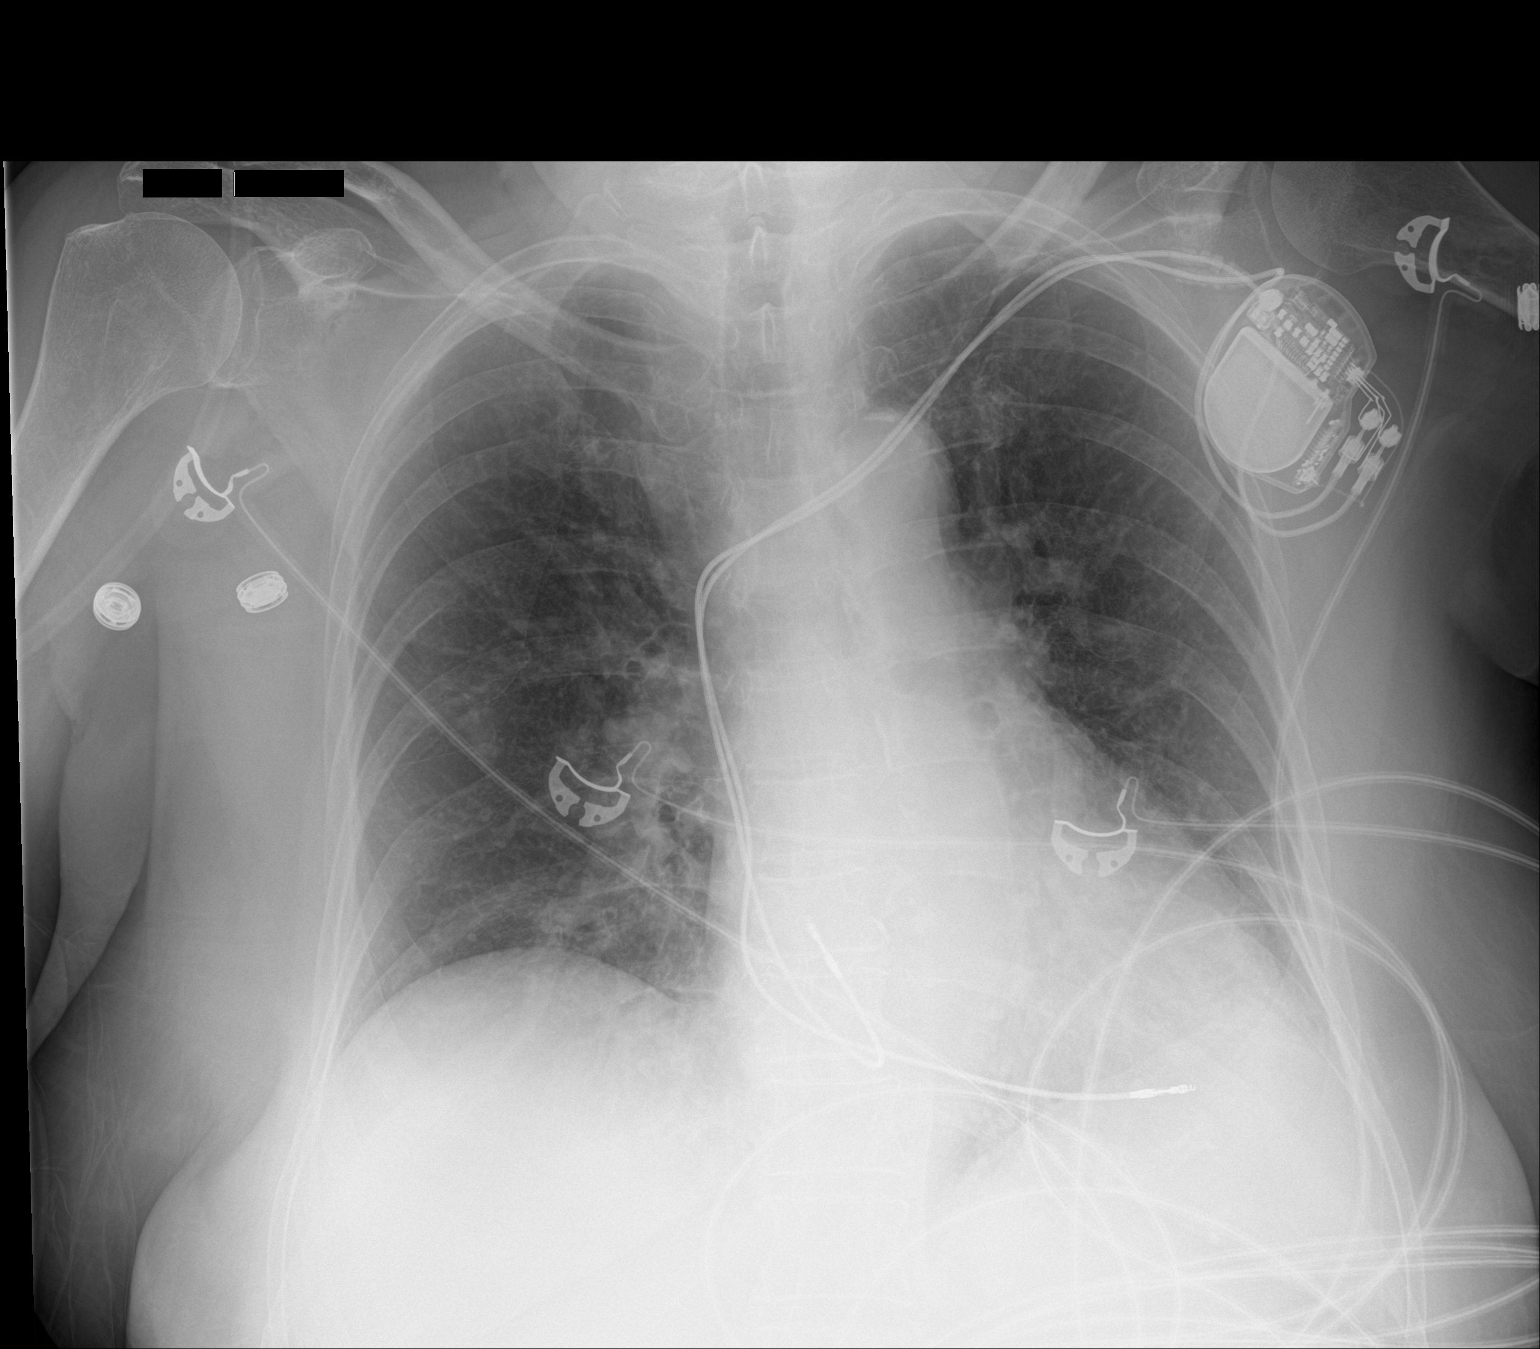

[1 of 1 positions shown; findings below may reference images not displayed]

FINDINGS: There is mild chronic interstitial coarsening. Left lung base
density, likely atelectasis. Developing infiltrate is not excluded.
Clinical correlation is recommended. There is no pleural effusion or
pneumothorax. Left pectoral pacemaker device. Atherosclerotic
calcification of the aorta. No acute osseous pathology.
IMPRESSION: Left lung base atelectasis versus less likely developing infiltrate.
Clinical correlation is recommended.

## 2020-05-12 IMAGING — CT CT HEAD W/O CM
3 series · 16 of 47 positions shown, 19 images · non-contrast
Comparison: None.

CLINICAL DATA: Urinary tract infection, altered level of
consciousness

EXAM:
CT HEAD WITHOUT CONTRAST
TECHNIQUE: Contiguous axial images were obtained from the base of the skull
through the vertex without intravenous contrast.

[Series 2: head wo · axial · 0.42mm/px · z∈[+1106,+1241]mm · 10 of 33 slices shown, 13 images]
[im 3/33  brain]
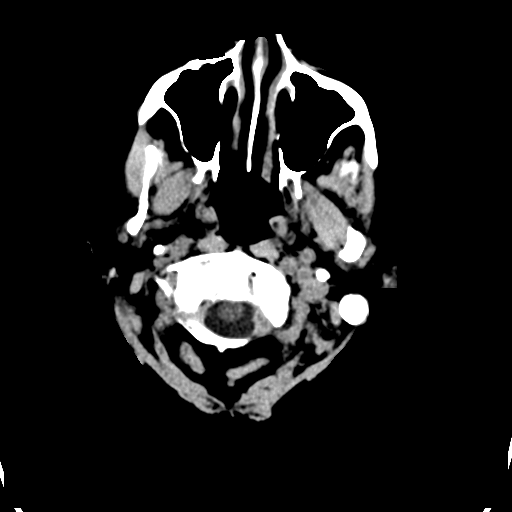
[im 3/33  bone]
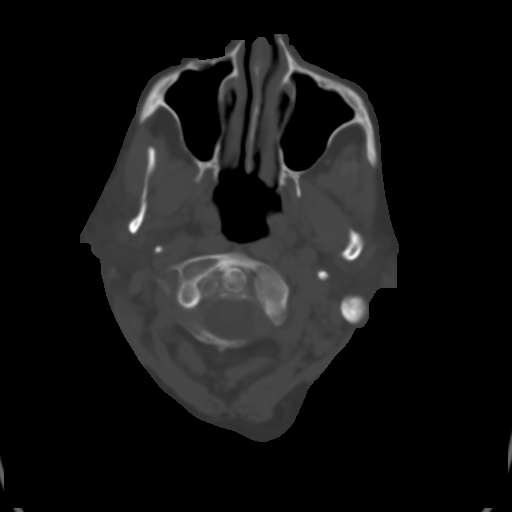
[im 6/33  brain]
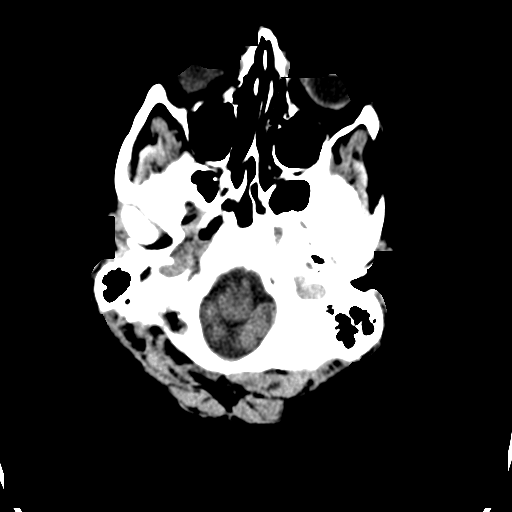
[im 9/33  brain]
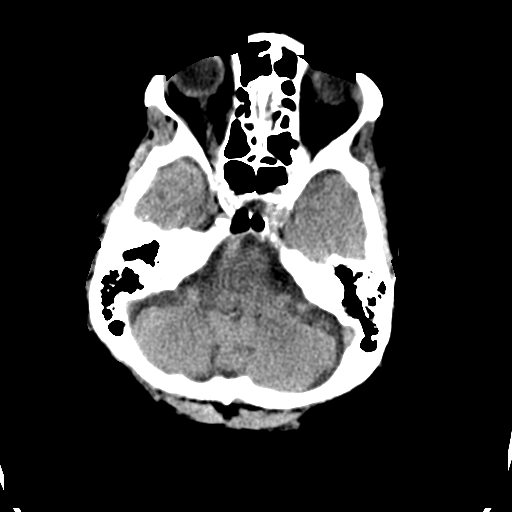
[im 12/33  brain]
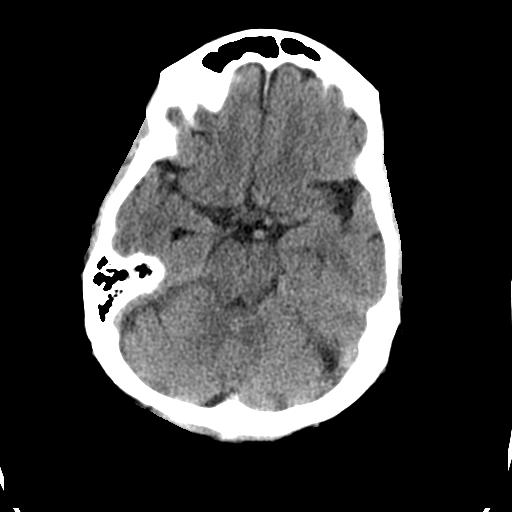
[im 15/33  brain]
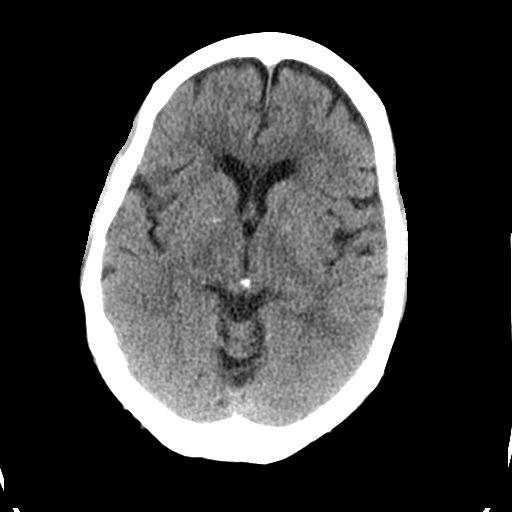
[im 15/33  bone]
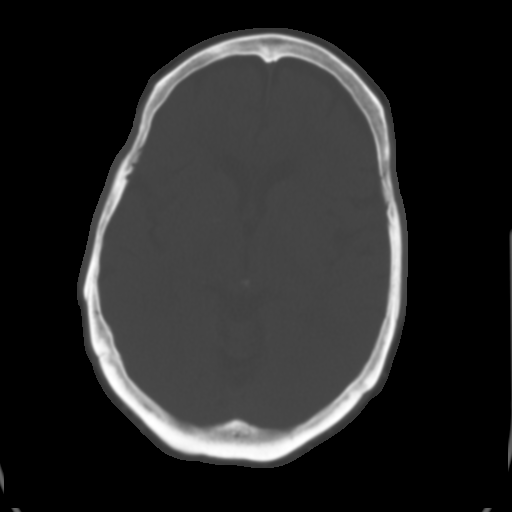
[im 18/33  brain]
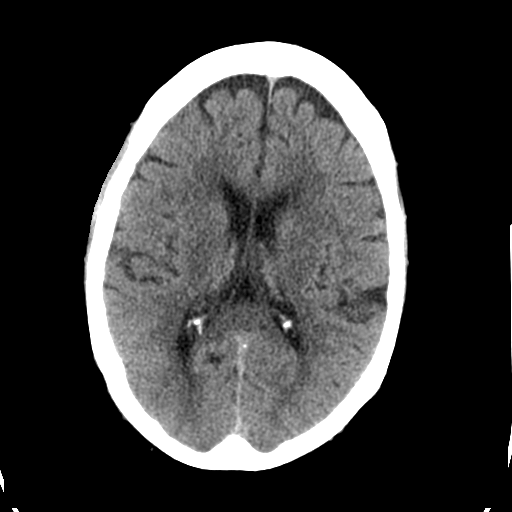
[im 21/33  brain]
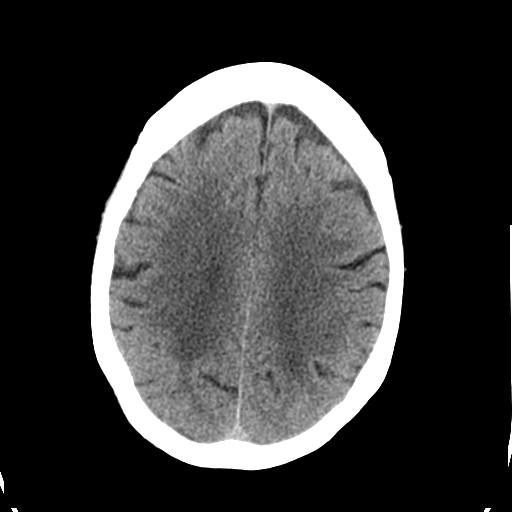
[im 25/33  brain]
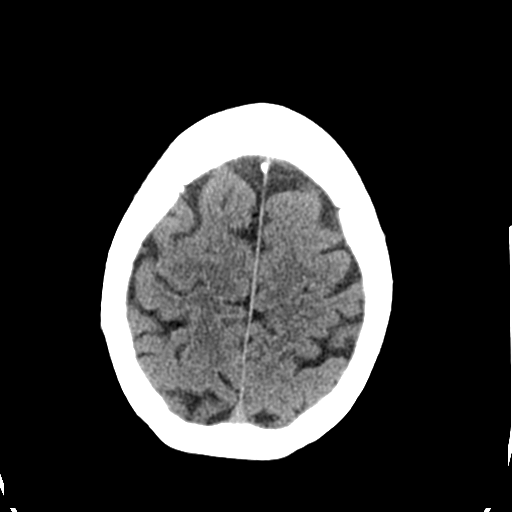
[im 27/33  brain]
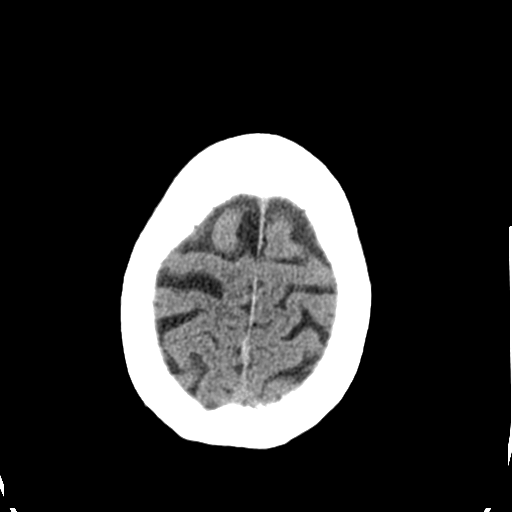
[im 27/33  bone]
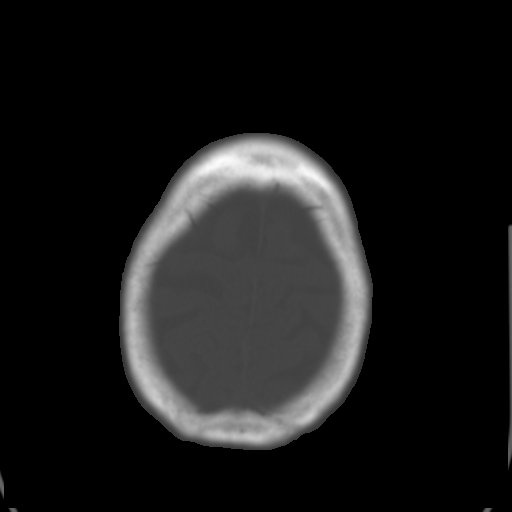
[im 30/33  brain]
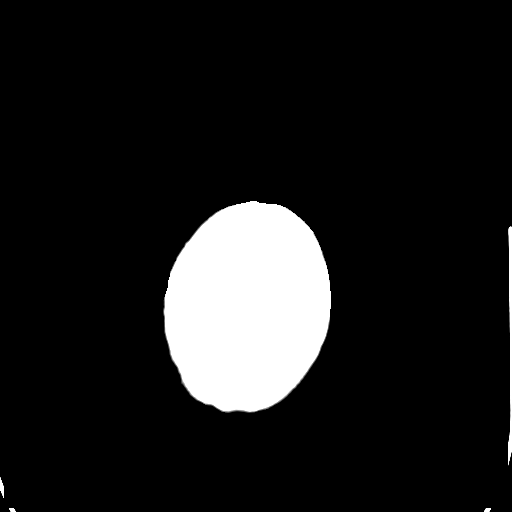

[Series 5: coronal soft tissue · coronal · 0.29mm/px · 3 of 66 slices shown]
[im 22/66  brain]
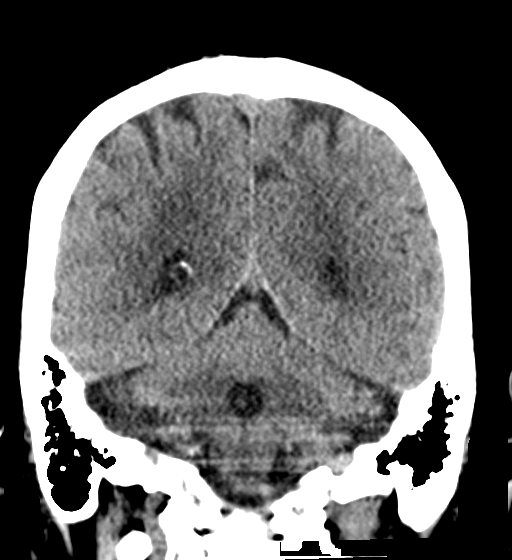
[im 29/66  brain]
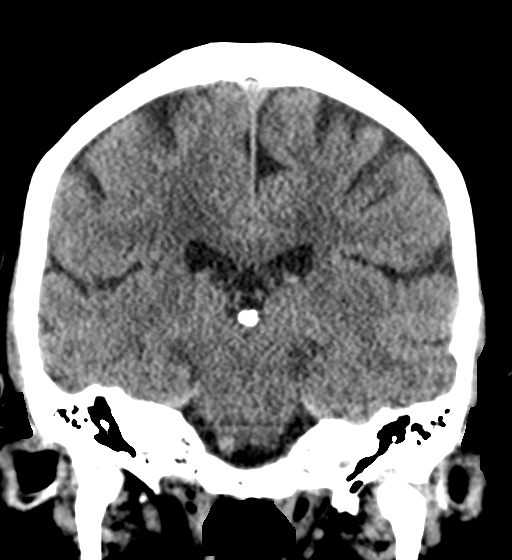
[im 37/66  brain]
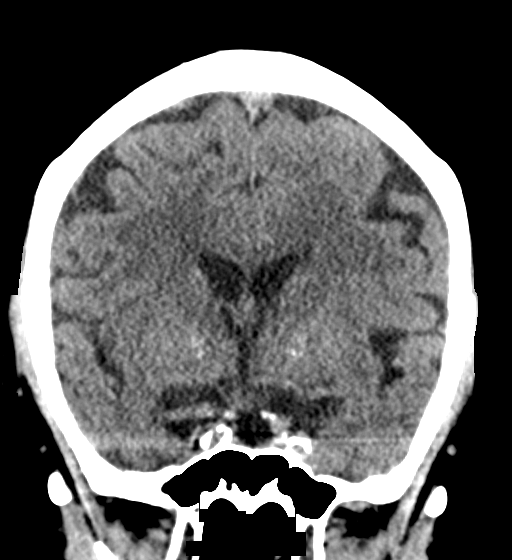

[Series 6: sagittal soft tissue · sagittal · 0.32mm/px · 3 of 50 slices shown]
[im 17/50  brain]
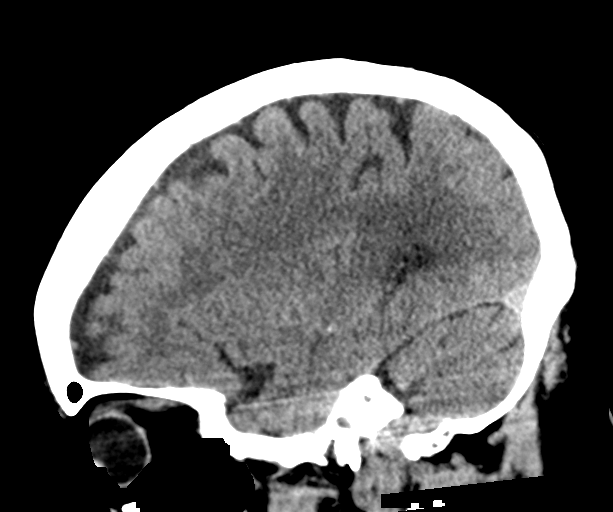
[im 25/50  brain]
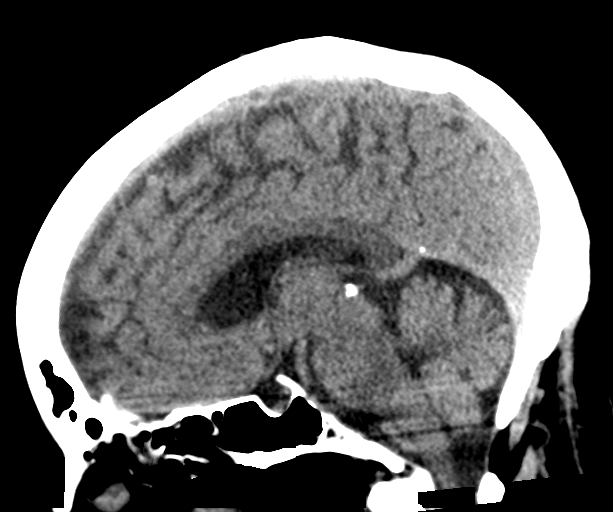
[im 33/50  brain]
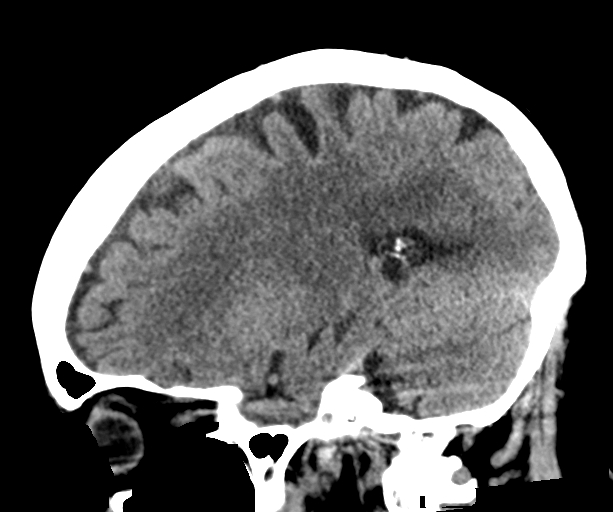

[16 of 47 positions shown; findings below may reference images not displayed]

FINDINGS: Brain: Hypodensities within the periventricular white matter are
consistent with age-indeterminate small vessel ischemic changes,
favor chronic. No other signs of acute infarct or hemorrhage.
Lateral ventricles and remaining midline structures are
unremarkable. There are no acute extra-axial fluid collections. No
mass effect.

Vascular: No hyperdense vessel or unexpected calcification.

Skull: Normal. Negative for fracture or focal lesion.

Sinuses/Orbits: No acute finding.

Other: None
IMPRESSION: 1. Age-indeterminate small-vessel ischemic changes in the
periventricular white matter, favor chronic.
2. Otherwise no acute intracranial process.

## 2020-05-21 ENCOUNTER — Encounter (FREE_STANDING_LABORATORY_FACILITY): Payer: Medicare Other

## 2020-05-21 DIAGNOSIS — E119 Type 2 diabetes mellitus without complications: Secondary | ICD-10-CM

## 2020-05-21 DIAGNOSIS — I4891 Unspecified atrial fibrillation: Secondary | ICD-10-CM

## 2020-05-21 DIAGNOSIS — E559 Vitamin D deficiency, unspecified: Secondary | ICD-10-CM

## 2020-05-21 DIAGNOSIS — E039 Hypothyroidism, unspecified: Secondary | ICD-10-CM

## 2020-05-21 LAB — LIPID PANEL
Cholesterol / HDL Ratio: 3.7
Cholesterol: 160 mg/dL (ref 0–199)
HDL: 43 mg/dL (ref 40–9999)
LDL Calculated: 93 mg/dL (ref 0–99)
Triglycerides: 120 mg/dL (ref 34–149)
VLDL Calculated: 24 mg/dL (ref 10–40)

## 2020-05-21 LAB — COMPREHENSIVE METABOLIC PANEL
ALT: 13 U/L (ref 0–55)
AST (SGOT): 18 U/L (ref 5–34)
Albumin/Globulin Ratio: 1.6 (ref 0.9–2.2)
Albumin: 4.2 g/dL (ref 3.5–5.0)
Alkaline Phosphatase: 58 U/L (ref 37–117)
Anion Gap: 11 (ref 5.0–15.0)
BUN: 10 mg/dL (ref 7.0–19.0)
Bilirubin, Total: 0.8 mg/dL (ref 0.2–1.2)
CO2: 27 mEq/L (ref 21–29)
Calcium: 9.4 mg/dL (ref 7.9–10.2)
Chloride: 99 mEq/L — ABNORMAL LOW (ref 100–111)
Creatinine: 0.8 mg/dL (ref 0.4–1.5)
Globulin: 2.7 g/dL (ref 2.0–3.7)
Glucose: 95 mg/dL (ref 70–100)
Potassium: 4.6 mEq/L (ref 3.5–5.1)
Protein, Total: 6.9 g/dL (ref 6.0–8.3)
Sodium: 137 mEq/L (ref 136–145)

## 2020-05-21 LAB — CBC AND DIFFERENTIAL
Absolute NRBC: 0 10*3/uL (ref 0.00–0.00)
Basophils Absolute Automated: 0.08 10*3/uL (ref 0.00–0.08)
Basophils Automated: 1.1 %
Eosinophils Absolute Automated: 0.31 10*3/uL (ref 0.00–0.44)
Eosinophils Automated: 4.2 %
Hematocrit: 39.4 % (ref 34.7–43.7)
Hgb: 12.9 g/dL (ref 11.4–14.8)
Immature Granulocytes Absolute: 0.03 10*3/uL (ref 0.00–0.07)
Immature Granulocytes: 0.4 %
Lymphocytes Absolute Automated: 2.09 10*3/uL (ref 0.42–3.22)
Lymphocytes Automated: 28.2 %
MCH: 29.9 pg (ref 25.1–33.5)
MCHC: 32.7 g/dL (ref 31.5–35.8)
MCV: 91.2 fL (ref 78.0–96.0)
MPV: 10 fL (ref 8.9–12.5)
Monocytes Absolute Automated: 0.46 10*3/uL (ref 0.21–0.85)
Monocytes: 6.2 %
Neutrophils Absolute: 4.45 10*3/uL (ref 1.10–6.33)
Neutrophils: 59.9 %
Nucleated RBC: 0 /100 WBC (ref 0.0–0.0)
Platelets: 245 10*3/uL (ref 142–346)
RBC: 4.32 10*6/uL (ref 3.90–5.10)
RDW: 13 % (ref 11–15)
WBC: 7.42 10*3/uL (ref 3.10–9.50)

## 2020-05-21 LAB — PSA: Prostate Specific Antigen, Total: <0.1 ng/mL

## 2020-05-21 LAB — HEMOGLOBIN A1C
Average Estimated Glucose: 105.4 mg/dL
Hemoglobin A1C: 5.3 % (ref 4.6–5.9)

## 2020-05-21 LAB — C-REACTIVE PROTEIN: C-Reactive Protein: 0.1 mg/dL (ref 0.0–0.8)

## 2020-05-21 LAB — TSH: TSH: 1.02 u[IU]/mL (ref 0.35–4.94)

## 2020-05-21 LAB — PT/INR
PT INR: 1.3 — ABNORMAL HIGH (ref 0.9–1.1)
PT: 14.6 s — ABNORMAL HIGH (ref 10.1–12.9)

## 2020-05-21 LAB — VITAMIN D,25 OH,TOTAL: Vitamin D, 25 OH, Total: 51 ng/mL (ref 30–100)

## 2020-05-21 LAB — GFR: EGFR: >60

## 2020-05-21 LAB — HEMOLYSIS INDEX: Hemolysis Index: 8 (ref 0–18)

## 2020-05-21 LAB — T3, FREE: T3, Free: 1.97 pg/mL (ref 1.71–3.71)

## 2020-06-04 ENCOUNTER — Encounter (FREE_STANDING_LABORATORY_FACILITY): Payer: Medicare Other

## 2020-06-04 DIAGNOSIS — Z7901 Long term (current) use of anticoagulants: Secondary | ICD-10-CM

## 2020-06-04 LAB — PT/INR
PT INR: 2.2 — ABNORMAL HIGH (ref 0.9–1.1)
PT: 25.3 s — ABNORMAL HIGH (ref 10.1–12.9)

## 2020-07-07 ENCOUNTER — Encounter (INDEPENDENT_AMBULATORY_CARE_PROVIDER_SITE_OTHER): Payer: Self-pay

## 2020-07-30 ENCOUNTER — Encounter (FREE_STANDING_LABORATORY_FACILITY): Payer: Medicare Other

## 2020-07-30 DIAGNOSIS — Z7901 Long term (current) use of anticoagulants: Secondary | ICD-10-CM

## 2020-07-30 LAB — COMPREHENSIVE METABOLIC PANEL
ALT: 10 U/L (ref 0–55)
AST (SGOT): 13 U/L (ref 5–34)
Albumin/Globulin Ratio: 1.5 (ref 0.9–2.2)
Albumin: 3.7 g/dL (ref 3.5–5.0)
Alkaline Phosphatase: 50 U/L (ref 37–117)
Anion Gap: 8 (ref 5.0–15.0)
BUN: 13 mg/dL (ref 7.0–19.0)
Bilirubin, Total: 0.6 mg/dL (ref 0.2–1.2)
CO2: 25 mEq/L (ref 21–29)
Calcium: 8.7 mg/dL (ref 7.9–10.2)
Chloride: 101 mEq/L (ref 100–111)
Creatinine: 0.7 mg/dL (ref 0.4–1.5)
Globulin: 2.5 g/dL (ref 2.0–3.7)
Glucose: 114 mg/dL — ABNORMAL HIGH (ref 70–100)
Potassium: 4.3 mEq/L (ref 3.5–5.1)
Protein, Total: 6.2 g/dL (ref 6.0–8.3)
Sodium: 134 mEq/L — ABNORMAL LOW (ref 136–145)

## 2020-07-30 LAB — PT/INR
PT INR: 2.2 — ABNORMAL HIGH (ref 0.9–1.1)
PT: 25.4 s — ABNORMAL HIGH (ref 10.1–12.9)

## 2020-07-30 LAB — HEMOLYSIS INDEX: Hemolysis Index: 9 (ref 0–24)

## 2020-07-30 LAB — GFR: EGFR: >60

## 2020-08-09 ENCOUNTER — Encounter (HOSPITAL_BASED_OUTPATIENT_CLINIC_OR_DEPARTMENT_OTHER): Payer: Medicare Other | Admitting: Cardiovascular Disease

## 2020-08-09 DIAGNOSIS — R0789 Other chest pain: Secondary | ICD-10-CM

## 2020-08-09 DIAGNOSIS — I480 Paroxysmal atrial fibrillation: Secondary | ICD-10-CM

## 2020-08-12 ENCOUNTER — Encounter (FREE_STANDING_LABORATORY_FACILITY): Payer: Medicare Other

## 2020-08-12 DIAGNOSIS — N390 Urinary tract infection, site not specified: Secondary | ICD-10-CM

## 2020-08-13 ENCOUNTER — Encounter (FREE_STANDING_LABORATORY_FACILITY): Payer: Medicare Other

## 2020-08-13 DIAGNOSIS — E538 Deficiency of other specified B group vitamins: Secondary | ICD-10-CM

## 2020-08-13 DIAGNOSIS — E119 Type 2 diabetes mellitus without complications: Secondary | ICD-10-CM

## 2020-08-13 DIAGNOSIS — N390 Urinary tract infection, site not specified: Secondary | ICD-10-CM

## 2020-08-13 LAB — URINALYSIS REFLEX TO MICROSCOPIC EXAM - REFLEX TO CULTURE
Bilirubin, UA: NEGATIVE
Glucose, UA: NEGATIVE
Ketones UA: NEGATIVE
Leukocyte Esterase, UA: NEGATIVE
Nitrite, UA: NEGATIVE
Protein, UR: NEGATIVE
Specific Gravity UA: 1.015 (ref 1.001–1.035)
Urine Bacteria: NONE SEEN /hpf
Urine pH: 7 (ref 5.0–8.0)
Urobilinogen, UA: 0.2 (ref 0.2–2.0)

## 2020-08-13 LAB — COMPREHENSIVE METABOLIC PANEL
ALT: 20 U/L (ref 0–55)
AST (SGOT): 19 U/L (ref 5–34)
Albumin/Globulin Ratio: 1.4 (ref 0.9–2.2)
Albumin: 4 g/dL (ref 3.5–5.0)
Alkaline Phosphatase: 42 U/L (ref 37–117)
Anion Gap: 9 (ref 5.0–15.0)
BUN: 13 mg/dL (ref 7.0–19.0)
Bilirubin, Total: 0.6 mg/dL (ref 0.2–1.2)
CO2: 27 mEq/L (ref 21–29)
Calcium: 8.9 mg/dL (ref 7.9–10.2)
Chloride: 101 mEq/L (ref 100–111)
Creatinine: 0.8 mg/dL (ref 0.4–1.5)
Globulin: 2.9 g/dL (ref 2.0–3.7)
Glucose: 122 mg/dL — ABNORMAL HIGH (ref 70–100)
Potassium: 4.7 mEq/L (ref 3.5–5.1)
Protein, Total: 6.9 g/dL (ref 6.0–8.3)
Sodium: 137 mEq/L (ref 136–145)

## 2020-08-13 LAB — CBC AND DIFFERENTIAL
Absolute NRBC: 0 10*3/uL (ref 0.00–0.00)
Basophils Absolute Automated: 0.06 10*3/uL (ref 0.00–0.08)
Basophils Automated: 0.9 %
Eosinophils Absolute Automated: 0.35 10*3/uL (ref 0.00–0.44)
Eosinophils Automated: 5.5 %
Hematocrit: 39.1 % (ref 34.7–43.7)
Hgb: 12.8 g/dL (ref 11.4–14.8)
Immature Granulocytes Absolute: 0.02 10*3/uL (ref 0.00–0.07)
Immature Granulocytes: 0.3 %
Lymphocytes Absolute Automated: 2.13 10*3/uL (ref 0.42–3.22)
Lymphocytes Automated: 33.6 %
MCH: 29.9 pg (ref 25.1–33.5)
MCHC: 32.7 g/dL (ref 31.5–35.8)
MCV: 91.4 fL (ref 78.0–96.0)
MPV: 9.7 fL (ref 8.9–12.5)
Monocytes Absolute Automated: 0.43 10*3/uL (ref 0.21–0.85)
Monocytes: 6.8 %
Neutrophils Absolute: 3.35 10*3/uL (ref 1.10–6.33)
Neutrophils: 52.9 %
Nucleated RBC: 0 /100 WBC (ref 0.0–0.0)
Platelets: 227 10*3/uL (ref 142–346)
RBC: 4.28 10*6/uL (ref 3.90–5.10)
RDW: 13 % (ref 11–15)
WBC: 6.34 10*3/uL (ref 3.10–9.50)

## 2020-08-13 LAB — VITAMIN B12: Vitamin B-12: 433 pg/mL (ref 211–911)

## 2020-08-13 LAB — HEMOGLOBIN A1C
Average Estimated Glucose: 114 mg/dL
Hemoglobin A1C: 5.6 % (ref 4.6–5.9)

## 2020-08-13 LAB — GFR: EGFR: >60

## 2020-08-13 LAB — HEMOLYSIS INDEX: Hemolysis Index: 33 — ABNORMAL HIGH (ref 0–24)

## 2020-08-25 ENCOUNTER — Encounter (INDEPENDENT_AMBULATORY_CARE_PROVIDER_SITE_OTHER): Payer: Self-pay | Admitting: Cardiovascular Disease

## 2020-08-25 NOTE — Progress Notes (Signed)
[place] [facility] C[facility] OFFICE CONSULTATION NOTE    HRT [facility] OFFICE -CARDIOLOGY  [address]  [place] [address]  Dept: [phone]  Dept Fax: [phone]         Patient Name: Margaret Shea    Date of Visit:  August 26, 2020  Date of Birth: December 02, 1931  AGE: 86 y.o.  Medical Record #: [id number]  Requesting Physician: No primary care provider on file.      CHIEF COMPLAINT:  Follow-up and Chest Pain      HISTORY OF PRESENT ILLNESS    Margaret Shea is being seen today for cardiovascular evaluation at the request of No primary care provider on file.. She is a pleasant 86 y.o. female who presents today after being seen in the hospital on 1213 by Dr. Bing Neighbors for atypical chest pain and orthostatic hypotension.  She is a very nice 86 year old female with a history of paroxysmal atrial fibrillation maintained on warfarin for anticoagulation (she has rejected the NOACs due to cost), status post [facility] Jude permanent pacemaker for sick sinus syndrome, hyperlipidemia, chronic hyponatremia, and orthostatic hypotension leading to several falls maintained on fludrocortisone who comes in today to establish care and follow-up after being seen in the hospital.  She presented with atypical chest pain with a negative work-up.  She had had a recent PET MPI study on 01/2019 that was normal and a normal echocardiogram in 04/2019.  It was not felt that she needed to be admitted to the hospital and she comes in today instead for follow-up.    Since her presentation to the ER a few weeks ago she has not had any further chest pain.  She has had another fall.  She describes her falls for which she has had 5 or 6 as occurring after she goes from seated to standing.  She becomes lightheaded and off balance and falls.  She is maintained on low-dose fludrocortisone but continues to have the symptoms.  She has not had any overt syncope from what I can tell.  She denies any shortness of  breath, peripheral edema or palpitations.    Her blood pressure is 124-126/72-76 and her heart rate is 62.      PAST MEDICAL HISTORY: She has a past medical history of Arrhythmia, Atrial fibrillation, Atrial tachycardia, Chest pain, Constipation, Gastroesophageal reflux disease, Hyperlipidemia, Hypothyroidism, Left arm pain, Pacemaker, PNA (pneumonia) (2016), Prolapse of uterus, SOB (shortness of breath), Tinnitus, Urinary tract infection (12/2015), Ventricular tachycardia, and Vertigo. She has a past surgical history that includes Back surgery (1980); Mohs surgery; Insert / replace / remove pacemaker (October 2011); Dental surgery; Cardiac catheterization (08/16/2015); Hysterectomy (2004); Adenoidectomy; Tonsillectomy; COLPECTOMY (N/A, 02/07/2016); CYSTOSCOPY (N/A, 02/07/2016); ECHOCARDIOGRAM, TRANSTHORACIC (05/07/2019); and PET MPI Study (01/28/2019).    ALLERGIES:   Allergies   Allergen Reactions   . Flecainide      Tinnitus,    . Propafenone      dizziness       MEDICATIONS:   Current Outpatient Medications   Medication Sig   . Cholecalciferol (VITAMIN D PO) Take by mouth daily.   . Cyanocobalamin (VITAMIN B 12) 250 MCG Lozenge Take by mouth.   . escitalopram (LEXAPRO) 10 MG tablet Take 20 mg by mouth daily      . famotidine (PEPCID) 20 MG tablet TAKE ONE TABLET BY MOUTH TWICE A DAY   . fludrocortisone (FLORINEF) 0.1 MG tablet Take 0.2 mg by mouth daily     . levothyroxine (  SYNTHROID, LEVOTHROID) 50 MCG tablet Take 50 mcg by mouth every morning.       . metFORMIN (FORTAMET) 500 MG (OSM) 24 hr tablet Take 500 mg by mouth every morning with breakfast 1/2 tab po qd   . nitroglycerin (NITROSTAT) 0.4 MG SL tablet Place 1 tablet (0.4 mg total) under the tongue as needed for Chest pain (q77min x3. call MD or 911 if pain persists).   . senna-docusate (PERICOLACE) 8.6-50 MG per tablet Take 2 tablets by mouth daily. (Patient taking differently: Take 2 tablets by mouth as needed.    )   . warfarin (COUMADIN) 4 MG tablet Take 4  mg by mouth   . esomeprazole (NEXIUM) 20 MG capsule Take 20 mg by mouth every morning before breakfast.   . metoprolol tartrate (LOPRESSOR) 25 MG tablet Take 0.5 tablets (12.5 mg total) by mouth 2 (two) times daily.With next refill   . vitamin B-12 (CYANOCOBALAMIN) 500 MCG tablet Take 2,500 mcg by mouth daily        FAMILY HISTORY: family history includes Coronary artery disease in her father; Hyperlipidemia in her father and sister; Myocardial Infarction in her father.    SOCIAL HISTORY: She reports that she has never smoked. She has never used smokeless tobacco. She reports current alcohol use of about 1.0 standard drink of alcohol per week. She reports that she does not use drugs.    REVIEW OF SYSTEMS:   General: Denies recent weight loss, weight gain, fever or chills or change in exercise tolerance.;  Generally frail  Integumentary: Denies any change in hair or nails, rashes, or skin lesions.;   Eyes: Denies diplopia, glaucoma or visual field defects.;   Ears, Nose, Throat, Mouth: Denies any hearing loss, epistaxis, hoarseness or difficulty speaking.;  Respiratory: Denies dyspnea, cough, wheezing or hemoptysis.;   Cardiovascular: Please review HPI;   Abdominal : Denies ulcer disease, hematochezia or melena.;  Musculoskeletal:Denies any venous insufficiency, arthritic symptoms or back problems.;   Neurological : Denies any recurrent strokes, TIA, or seizure disorder.;   Psychiatric: Denies any depression, substance abuse or change in cognitive functions.;   Endocrine: Denies any weight change, heat/cold intolerance, polydipsia, or polyuria;   Hematologic/Immunologic: Denies any food allergies, seasonal allergies, bleeding disorders.   All other systems reviewed and negative except as above.       PHYSICAL EXAMINATION    Visit Vitals  BP 126/72 (BP Site: Left arm, Patient Position: Sitting, Cuff Size: Medium)   Pulse 62   Ht 1.626 m (5\' 4" )   Wt 58.8 kg (129 lb 9.6 oz)   LMP  (LMP Unknown)   BMI 22.25 kg/m         General Appearance:  A well-appearing female in no acute distress.  Frail-appearing  Skin: Warm and dry to touch, no apparent skin lesions, or masses noted.  Head: Normocephalic, normal hair pattern, no masses or tenderness   Eyes: EOMS Intact, PERRL, conjunctivae and lids unremarkable.  ENT: Ears, Nose and throat reveal no gross abnormalities.  No pallor or cyanosis.  Neck: JVP normal, no carotid bruit  Chest: Clear to auscultation bilaterally with good air movement and respiratory effort and no wheezes, rales, or rhonchi   Cardiovascular: Regular rhythm, S1 normal, S2 normal, No S3 or S4. No murmur. No gallops or rubs detected   Abdomen: Soft, nontender, nondistended  Extremities: Warm without edema. No clubbing, or cyanosis.   Neuro: Alert and oriented x3. No gross motor or sensory deficits noted, affect appropriate.  ECG: Electronic atrial pacemaker      LABS:   Lab Results   Component Value Date    WBC 6.34 08/13/2020    HGB 12.8 08/13/2020    HCT 39.1 08/13/2020    PLT 227 08/13/2020     Lab Results   Component Value Date    GLU 122 (H) 08/13/2020    BUN 13.0 08/13/2020    CREAT 0.8 08/13/2020    NA 137 08/13/2020    K 4.7 08/13/2020    CL 101 08/13/2020    CO2 27 08/13/2020    AST 19 08/13/2020    ALT 20 08/13/2020     Lab Results   Component Value Date    TSH 1.02 05/21/2020    HGBA1C 5.6 08/13/2020     Lab Results   Component Value Date    CHOL 160 05/21/2020    TRIG 120 05/21/2020    HDL 43 05/21/2020    LDL 93 05/21/2020           IMPRESSION:   Margaret Shea is a 86 y.o. female with the following problems:    1. Orthostatic hypotension leading to several falls, currently maintained on low-dose fludrocortisone  2. Paroxysmal atrial fibrillation maintained on warfarin that is being followed by [facility]  3. Echocardiogram 05/07/1999 with normal LV size and function, EF of 65%, no significant valvular disease  4. PET myocardial perfusion study 01/28/2019 with normal myocardial perfusion and a normal EF  of 76%  5. Atypical chest pain  6. Sciatica  7. Hyponatremia possibly related to SSRI  8. Saint Jude permanent pacemaker that may be reaching end-of-life  9. TIA      RECOMMENDATIONS:    1.  We spoke about her orthostatic hypotension.  I recommended increasing the salt in her diet and her hydration.  We will increase her fludrocortisone to 0.2 mg daily.  If she becomes hypertensive she will notify us.  2.  We will set her up for pacemaker clinic to have her device interrogated and determine if she is close to end-of-life  3.  She does not need additional cardiac testing at this time  4.  She will plan to come back and see me in 3 months or earlier if any symptoms or problems arise.    Thank you so much for including me in her care.                                                 Orders Placed This Encounter   Procedures   . Comprehensive metabolic panel   . Lipid panel   . Follow Up Device Check   . ECG 12 lead (Normal)   . OFFICE VISIT 30 MIN (HRT [place])       No orders of the defined types were placed in this encounter.        SIGNED:    Lynnea Maizes, MD         This note was generated by the Dragon speech recognition and may contain errors or omissions not intended by the user. Grammatical errors, random word insertions, deletions, pronoun errors, and incomplete sentences are occasional consequences of this technology due to software limitations. Not all errors are caught or corrected. If there are questions or concerns about the content of this note or information contained within the body  of this dictation, they should be addressed directly with the author for clarification.

## 2020-08-26 ENCOUNTER — Ambulatory Visit (INDEPENDENT_AMBULATORY_CARE_PROVIDER_SITE_OTHER): Payer: Medicare Other | Admitting: Cardiovascular Disease

## 2020-08-26 VITALS — BP 126/72 | HR 62 | Ht 64.0 in | Wt 129.6 lb

## 2020-08-26 DIAGNOSIS — I472 Ventricular tachycardia, unspecified: Secondary | ICD-10-CM

## 2020-08-26 DIAGNOSIS — E785 Hyperlipidemia, unspecified: Secondary | ICD-10-CM

## 2020-08-26 DIAGNOSIS — I480 Paroxysmal atrial fibrillation: Secondary | ICD-10-CM

## 2020-09-10 ENCOUNTER — Ambulatory Visit (INDEPENDENT_AMBULATORY_CARE_PROVIDER_SITE_OTHER): Payer: Medicare Other

## 2020-09-10 DIAGNOSIS — Z950 Presence of cardiac pacemaker: Secondary | ICD-10-CM

## 2020-09-10 DIAGNOSIS — I472 Ventricular tachycardia, unspecified: Secondary | ICD-10-CM

## 2020-09-10 NOTE — Progress Notes (Addendum)
Patient was seen in clinic today for in office device check.  Device information as follows.  For more detailed device information please contact [facility] staff at [phone].      Device Interrogation @IPTODAYDATE @:   Manufacturer: STJ  Model: [id number]   Implant date: 06/24/2010    Reason for evaluation: Routine    Device Parameters  Battery Longevity: 2.3 MONTHS   Mode: DDDRLower rate: 60 bpm.  Upper rate: 130 bpm  Atrial output:  1.62 V @  0.4 ms  Atrial sensitivity: 0.5 mV  Ventricular output:   RV: 1.5 V @  0.4 ms               Ventricular sensitivity: RV:  2.0 mV    Device Measurements  Atrial lead impedance: 340 ohms  Atrial capture threshold: 0.62 V @ 0.4 ms   Atrial sensing - P wave: 2.9 mV  Ventricular lead impedance:   RV: 560 ohms                   Ventricular capture threshold:   RV: 1.25 V @ 0.4 ms          Ventricular sensing - R wave: 12.0 mV  Underlying rhythm: SB    Changes/Observations  Observations/Arrhythmias: Battery shows 2.3 mo to ERI. AT/AF burden <1%. Longest 25 min. Occasional rates > 100 bpm. On OAC. Occasional noise on V lead.  Changes made: Tested and enabled V auto cap.     set up OV with JD, on 09/20/20 @ AI @ 11:00 to set up gen change    Thank you,  [facility]

## 2020-09-20 ENCOUNTER — Ambulatory Visit (INDEPENDENT_AMBULATORY_CARE_PROVIDER_SITE_OTHER): Payer: Medicare Other | Admitting: Cardiovascular Disease

## 2020-09-20 ENCOUNTER — Encounter (INDEPENDENT_AMBULATORY_CARE_PROVIDER_SITE_OTHER): Payer: Self-pay | Admitting: Cardiovascular Disease

## 2020-09-20 VITALS — BP 152/82 | HR 72 | Ht 64.0 in | Wt 132.6 lb

## 2020-09-20 DIAGNOSIS — I495 Sick sinus syndrome: Secondary | ICD-10-CM

## 2020-09-20 DIAGNOSIS — Z45010 Encounter for checking and testing of cardiac pacemaker pulse generator [battery]: Secondary | ICD-10-CM

## 2020-09-20 DIAGNOSIS — I480 Paroxysmal atrial fibrillation: Secondary | ICD-10-CM

## 2020-09-20 NOTE — Progress Notes (Addendum)
[place] [facility] ELECTROPHY[facility]Y OFFICE CONSULTATION NOTE    HRT [facility] OFFICE      [place] [facility] OFFICE [facility]  [address]  [place] [address]  Dept: [phone]  Dept Fax: [phone]         Patient Name: Margaret Shea    Date of Visit:  September 20, 2020  Date of Birth: May 23, 1932  AGE: 87 y.o.  Medical Record #: [id number]    Primary Electrophysiologist: Ida Rogue MD, [facility]    Primary Cardiologist: Era Skeen MD, [facility]      CHIEF COMPLAINT:  Atrial Fibrillation (EP consult for gen chance, establishing care) and Device Check      HISTORY OF PRESENT ILLNESS    Margaret Shea is being seen today for Electrophysiology evaluation at the request of Era Skeen MD, [facility] She is a pleasant 87 y.o. female who had a Saint Jude pacemaker placed in 2011 for sick sinus syndrome.  She has had a low burden of paroxysmal atrial fibrillation at 1.1%.  She continues on Coumadin anticoagulation with steady INR at 2.2 and 2.3.  She has some orthostatic hypotension.  She has occasional palpitations with faster heartbeat.  Her device was noted to be approaching elective replacement marker and she presents for electrophysiology evaluation and to discuss pacemaker generator change.  She has ongoing fatigue.  I showed her her heart rate histogram with normal heart rates and told her that unfortunately there is not a device adjustment that I have that would be helpful in terms of her fatigue.    Her pacemaker is at elective replacement battery voltage.    PAST MEDICAL HISTORY: She has a past medical history of Arrhythmia, Atrial fibrillation, Atrial tachycardia, Chest pain, Constipation, Gastroesophageal reflux disease, Hyperlipidemia, Hypothyroidism, Left arm pain, Pacemaker, PNA (pneumonia) (2016), Prolapse of uterus, SOB (shortness of breath), Tinnitus, Urinary tract infection (12/2015), Ventricular tachycardia, and Vertigo. She has a past surgical history that includes Back surgery  (1980); Mohs surgery; Insert / replace / remove pacemaker (October 2011); Dental surgery; Cardiac catheterization (08/16/2015); Hysterectomy (2004); Adenoidectomy; Tonsillectomy; COLPECTOMY (N/A, 02/07/2016); CYSTOSCOPY (N/A, 02/07/2016); ECHOCARDIOGRAM, TRANSTHORACIC (05/07/2019); and PET MPI Study (01/28/2019).    ALLERGIES:   Allergies   Allergen Reactions   . Flecainide      Tinnitus,    . Propafenone      dizziness       MEDICATIONS:   Current Outpatient Medications   Medication Sig   . Cholecalciferol (VITAMIN D PO) Take by mouth daily.   . Cyanocobalamin (VITAMIN B 12) 250 MCG Lozenge Take by mouth.   . escitalopram (LEXAPRO) 10 MG tablet Take 20 mg by mouth daily      . esomeprazole (NEXIUM) 20 MG capsule Take 20 mg by mouth every morning before breakfast.   . famotidine (PEPCID) 20 MG tablet TAKE ONE TABLET BY MOUTH TWICE A DAY   . fludrocortisone (FLORINEF) 0.1 MG tablet Take 0.2 mg by mouth daily     . levothyroxine (SYNTHROID, LEVOTHROID) 50 MCG tablet Take 50 mcg by mouth every morning.       . metFORMIN (FORTAMET) 500 MG (OSM) 24 hr tablet Take 500 mg by mouth every morning with breakfast 1/2 tab po qd   . metoprolol tartrate (LOPRESSOR) 25 MG tablet Take 0.5 tablets (12.5 mg total) by mouth 2 (two) times daily.With next refill   . nitroglycerin (NITROSTAT) 0.4 MG SL tablet Place 1 tablet (0.4 mg total) under the tongue as needed for Chest  pain (q34min x3. call MD or 911 if pain persists).   . senna-docusate (PERICOLACE) 8.6-50 MG per tablet Take 2 tablets by mouth daily. (Patient taking differently: Take 2 tablets by mouth as needed.    )   . vitamin B-12 (CYANOCOBALAMIN) 500 MCG tablet Take 2,500 mcg by mouth daily   . warfarin (COUMADIN) 4 MG tablet Take 4 mg by mouth        FAMILY HISTORY: family history includes Coronary artery disease in her father; Hyperlipidemia in her father and sister; Myocardial Infarction in her father.    SOCIAL HISTORY: She reports that she has never smoked. She has never  used smokeless tobacco. She reports current alcohol use of about 1.0 standard drink of alcohol per week. She reports that she does not use drugs.    REVIEW OF SYSTEMS:   General: fatigue  Integumentary: Denies any change in hair or nails, rashes, or skin lesions.;   Eyes: Denies diplopia, glaucoma or visual field defects.;   Ears, Nose, Throat, Mouth: Denies any hearing loss, epistaxis, hoarseness or difficulty speaking.;  Respiratory: Denies dyspnea, cough, wheezing or hemoptysis.;   Cardiovascular: Please review HPI;   Abdominal : Denies ulcer disease, hematochezia or melena.;  Musculoskeletal:arthritis  Neurological : Denies any recurrent strokes, TIA, or seizure disorder.;   Psychiatric: Denies any depression, substance abuse or change in cognitive functions.;   Endocrine: hypothyroid  Hematologic/Immunologic: Denies any food allergies, seasonal allergies, bleeding disorders.   All other systems reviewed and negative except as above.     PHYSICAL EXAMINATION  Visit Vitals  BP 152/82 (BP Site: Left arm, Patient Position: Sitting, Cuff Size: Medium)   Pulse 72   Ht 1.626 m (5\' 4" )   Wt 60.1 kg (132 lb 9.6 oz)   LMP  (LMP Unknown)   BMI 22.76 kg/m     General Appearance:  A well-appearing female in no acute distress.    Skin: Warm and dry to touch, no apparent skin lesions, or masses noted.  Head: Normocephalic,  Eyes: EOMS Intact,  conjunctivae and lids unremarkable.    Neck: JVP normal, no carotid bruit, thyroid not enlarged  Chest: Clear to auscultation bilaterally with good air movement and respiratory effort and no wheezes, rales, or rhonchi   Cardiovascular: Regular rhythm, S1 normal, S2 normal, No S3 or S4. Apical impulse not displaced,. No murmur. No gallops or rubs detected   Abdomen: Soft, nontender, nondistended, with normoactive bowel sounds. No organomegaly.  No pulsatile masses, or bruits.   Extremities: bilateral radial pulses 2+  Neuro: Alert and oriented x3.   Psych: mood and affect  appropriate.    ECG: Atrial paced, PAC    LABS:   Lab Results   Component Value Date    WBC 6.34 08/13/2020    HGB 12.8 08/13/2020    HCT 39.1 08/13/2020    PLT 227 08/13/2020     Lab Results   Component Value Date    GLU 122 (H) 08/13/2020    BUN 13.0 08/13/2020    CREAT 0.8 08/13/2020    NA 137 08/13/2020    K 4.7 08/13/2020    CL 101 08/13/2020    CO2 27 08/13/2020    CA 8.9 08/13/2020    PROT 6.9 08/13/2020    AST 19 08/13/2020    ALT 20 08/13/2020    BILITOTAL 0.6 08/13/2020    GLOB 2.9 08/13/2020    ALB 4.0 08/13/2020     Lab Results   Component Value Date  TSH 1.02 05/21/2020    HGBA1C 5.6 08/13/2020       IMPRESSION:   Margaret Shea is a 87 y.o. female with the following problems:    1. Sick sinus syndrome  2. St Jude pacemaker with pacemaker at elective replacement voltage  3. Paroxysmal atrial fibrillation with low burden  4. Orthostatic hypotension  5. Echocardiogram 2016 with normal LV function  6. TIA    Margaret Shea is an 87 year old woman with sick sinus syndrome and low burden of paroxysmal atrial fibrillation with rapid rate and orthostatic intolerance.  She is tolerating Coumadin anticoagulation.  Her [facility] pacemaker has reached elective replacement we will plan for dual-chamber pacemaker generator change at [facility] which is the closest hospital to her residence at [facility].  Procedure was described to the patient, risks explained, she and her son understand and agree to proceed.  She will hold her medications on the day of procedure and will do this procedure on uninterrupted Coumadin after discussed the risk-benefit ratio of continuation versus holding anticoagulation.    RECOMMENDATIONS:    Dual chamber pacemaker generator change on coumadin anticoagulation  Will need prn short acting metoprolol for af with faster rates    SIGNED:    Ida Rogue, MD         This note was generated by the Dragon speech recognition and may contain errors or omissions not intended by the user.  Grammatical errors, random word insertions, deletions, pronoun errors, and incomplete sentences are occasional consequences of this technology due to software limitations. Not all errors are caught or corrected. If there are questions or concerns about the content of this note or information contained within the body of this dictation, they should be addressed directly with the author for clarification.

## 2020-09-23 ENCOUNTER — Encounter (INDEPENDENT_AMBULATORY_CARE_PROVIDER_SITE_OTHER): Payer: Self-pay | Admitting: Cardiovascular Disease

## 2020-09-23 ENCOUNTER — Other Ambulatory Visit (INDEPENDENT_AMBULATORY_CARE_PROVIDER_SITE_OTHER): Payer: Self-pay

## 2020-09-23 DIAGNOSIS — I472 Ventricular tachycardia, unspecified: Secondary | ICD-10-CM

## 2020-09-23 DIAGNOSIS — I495 Sick sinus syndrome: Secondary | ICD-10-CM

## 2020-09-23 DIAGNOSIS — I480 Paroxysmal atrial fibrillation: Secondary | ICD-10-CM

## 2020-09-24 ENCOUNTER — Encounter (FREE_STANDING_LABORATORY_FACILITY): Payer: Medicare Other

## 2020-09-24 ENCOUNTER — Encounter (INDEPENDENT_AMBULATORY_CARE_PROVIDER_SITE_OTHER): Payer: Self-pay

## 2020-09-24 ENCOUNTER — Telehealth (INDEPENDENT_AMBULATORY_CARE_PROVIDER_SITE_OTHER): Payer: Self-pay

## 2020-09-24 ENCOUNTER — Ambulatory Visit (INDEPENDENT_AMBULATORY_CARE_PROVIDER_SITE_OTHER): Payer: Self-pay

## 2020-09-24 DIAGNOSIS — Z7901 Long term (current) use of anticoagulants: Secondary | ICD-10-CM

## 2020-09-24 LAB — PT/INR
PT INR: 4.4 — ABNORMAL HIGH (ref 0.9–1.1)
PT: 52.3 s — ABNORMAL HIGH (ref 10.1–12.9)

## 2020-09-24 NOTE — Telephone Encounter (Signed)
Per [facility] RN, Tseday, request. Provider order for INR management through the weekend created by Albesa Seen, PA-C via letter and hand signed, faxed to [phone]. Fax confirmation received.

## 2020-09-24 NOTE — Telephone Encounter (Signed)
Spoke with pt's son, re: INR. Son states pt lives at [facility] and to contact RN, Tseday (pronouced [phone]) for INR management. Thanked pt's son for time and assistance. Advised will f/u with [facility] and call back for any further needs. Son verbalized understanding and confirms knowledge of need to recheck INRs on 2/1 and 2/3 per pre-procedure instructions.

## 2020-09-24 NOTE — Telephone Encounter (Signed)
LMTCB re: pre-procedure INR 4.4. Procedure date 10/01/20 at [facility] with JD. Goal 2.0-3.0 with recheck scheduled 09/28/20 per pre-procedure INR management letter.

## 2020-09-24 NOTE — Progress Notes (Signed)
Spoke with [facility] at [phone] re: pt's pre-procedure INR of 4.4. Spoke with pt's nurse, Tseday, RN. Per RN, pt historically takes 5.5mg  daily (total of 38.5mg /wk). Advised to decrease tonight-Sunday warfarin dosing to 4mg  with resuming warfarin dosing of 5.5mg  on Monday. Pt will recheck INR on Tuesday, 2/1. Tseday, RN read back information and verbalized understanding. Tseday requesting a letter from a provider with above instructions to be signed and faxed to [phone]. Advised will f/u with a provider and provide requested information to above fax number ASAP. Tseday verbalized understanding.

## 2020-09-28 ENCOUNTER — Other Ambulatory Visit (INDEPENDENT_AMBULATORY_CARE_PROVIDER_SITE_OTHER): Payer: Self-pay | Admitting: Cardiovascular Disease

## 2020-09-28 ENCOUNTER — Telehealth (INDEPENDENT_AMBULATORY_CARE_PROVIDER_SITE_OTHER): Payer: Self-pay

## 2020-09-28 NOTE — Telephone Encounter (Signed)
LMTCB re: pre-procedure INR as not, yet, drawn.

## 2020-09-29 ENCOUNTER — Ambulatory Visit (INDEPENDENT_AMBULATORY_CARE_PROVIDER_SITE_OTHER): Payer: Self-pay

## 2020-09-29 ENCOUNTER — Encounter (INDEPENDENT_AMBULATORY_CARE_PROVIDER_SITE_OTHER): Payer: Self-pay | Admitting: Physician Assistant

## 2020-09-29 LAB — CBC AND DIFFERENTIAL
Baso(Absolute): 62 cells/uL (ref 0–200)
Basophils: 1 %
Eosinophils Absolute: 217 cells/uL (ref 15–500)
Eosinophils: 3.5 %
Hematocrit: 40.5 % (ref 35.0–45.0)
Hemoglobin: 13.1 g/dL (ref 11.7–15.5)
Lymphocytes Absolute: 1538 cells/uL (ref 850–3900)
Lymphocytes: 24.8 %
MCH: 29.2 pg (ref 27.0–33.0)
MCHC: 32.3 g/dL (ref 32.0–36.0)
MCV: 90.4 fL (ref 80.0–100.0)
MPV: 10.4 fL (ref 7.5–12.5)
Monocytes Absolute: 459 cells/uL (ref 200–950)
Monocytes: 7.4 %
Neutrophils Absolute: 3925 cells/uL (ref 1500–7800)
Neutrophils: 63.3 %
Platelets: 254 10*3/uL (ref 140–400)
RBC: 4.48 10*6/uL (ref 3.80–5.10)
RDW: 13 % (ref 11.0–15.0)
WBC: 6.2 10*3/uL (ref 3.8–10.8)

## 2020-09-29 LAB — BASIC METABOLIC PANEL
BUN: 11 mg/dL (ref 7–25)
CO2: 33 mmol/L — ABNORMAL HIGH (ref 20–32)
Calcium: 9.3 mg/dL (ref 8.6–10.4)
Chloride: 101 mmol/L (ref 98–110)
Creatinine: 0.76 mg/dL (ref 0.60–0.88)
EGFR African American: 82 mL/min/{1.73_m2} (ref >=60)
Glucose: 132 mg/dL — ABNORMAL HIGH (ref 65–99)
NON-AFRICAN AMERICA EGFR: 71 mL/min/{1.73_m2} (ref >=60)
Potassium: 4.3 mmol/L (ref 3.5–5.3)
Sodium: 141 mmol/L (ref 135–146)

## 2020-09-29 LAB — PT AND APTT
PT INR: 2.3 — ABNORMAL HIGH
PT: 22.2 s — ABNORMAL HIGH (ref 9.0–11.5)
aPTT: 39 s — ABNORMAL HIGH (ref 23–32)

## 2020-09-29 NOTE — Progress Notes (Signed)
Spoke with [facility] this morning to confirm pt took 5.5mg  last night. Per previous request, obtained written provider instructions acknowledging INR results with warfarin instructions. Instructions per Karis Juba PhD, DrPH, DMSc, PA-C, pt to take 4mg  this evening with INR recheck tomorrow. Faxed to [phone]. Called to confirm this evening's warfarin instructions with [facility], spoke with Aiden, RN, who declines verbal warfarin instructions and advises will wait for faxed instructions. Fax confirmation received.

## 2020-09-29 NOTE — Progress Notes (Signed)
Letter created today for pre-procedure INR's.      Karis Juba PhD, DrPH, DMSc, PA-C   Cardiac Electrophysiology   [facility]

## 2020-09-30 ENCOUNTER — Other Ambulatory Visit (INDEPENDENT_AMBULATORY_CARE_PROVIDER_SITE_OTHER): Payer: Self-pay | Admitting: Cardiovascular Disease

## 2020-10-01 ENCOUNTER — Inpatient Hospital Stay (INDEPENDENT_AMBULATORY_CARE_PROVIDER_SITE_OTHER): Payer: Medicare Other | Admitting: Cardiovascular Disease

## 2020-10-01 ENCOUNTER — Ambulatory Visit (INDEPENDENT_AMBULATORY_CARE_PROVIDER_SITE_OTHER): Payer: Self-pay

## 2020-10-01 ENCOUNTER — Encounter (HOSPITAL_BASED_OUTPATIENT_CLINIC_OR_DEPARTMENT_OTHER): Payer: Medicare Other | Admitting: Cardiovascular Disease

## 2020-10-01 DIAGNOSIS — Z45010 Encounter for checking and testing of cardiac pacemaker pulse generator [battery]: Secondary | ICD-10-CM

## 2020-10-01 DIAGNOSIS — I495 Sick sinus syndrome: Secondary | ICD-10-CM

## 2020-10-01 LAB — CBC AND DIFFERENTIAL
Baso(Absolute): 42 cells/uL (ref 0–200)
Basophils: 0.7 %
Eosinophils Absolute: 222 cells/uL (ref 15–500)
Eosinophils: 3.7 %
Hematocrit: 41.8 % (ref 35.0–45.0)
Hemoglobin: 13.3 g/dL (ref 11.7–15.5)
Lymphocytes Absolute: 1506 cells/uL (ref 850–3900)
Lymphocytes: 25.1 %
MCH: 30 pg (ref 27.0–33.0)
MCHC: 31.8 g/dL — ABNORMAL LOW (ref 32.0–36.0)
MCV: 94.4 fL (ref 80.0–100.0)
MPV: 10.3 fL (ref 7.5–12.5)
Monocytes Absolute: 408 cells/uL (ref 200–950)
Monocytes: 6.8 %
Neutrophils Absolute: 3822 cells/uL (ref 1500–7800)
Neutrophils: 63.7 %
Platelets: 241 10*3/uL (ref 140–400)
RBC: 4.43 10*6/uL (ref 3.80–5.10)
RDW: 13.1 % (ref 11.0–15.0)
WBC: 6 10*3/uL (ref 3.8–10.8)

## 2020-10-01 LAB — BASIC METABOLIC PANEL
BUN: 8 mg/dL (ref 7–25)
CO2: 31 mmol/L (ref 20–32)
Calcium: 9.1 mg/dL (ref 8.6–10.4)
Chloride: 101 mmol/L (ref 98–110)
Creatinine: 0.78 mg/dL (ref 0.60–0.88)
EGFR African American: 80 mL/min/{1.73_m2} (ref >=60)
Glucose: 210 mg/dL — ABNORMAL HIGH (ref 65–139)
NON-AFRICAN AMERICA EGFR: 69 mL/min/{1.73_m2} (ref >=60)
Potassium: 4 mmol/L (ref 3.5–5.3)
Sodium: 141 mmol/L (ref 135–146)

## 2020-10-01 LAB — PT/INR
PT INR: 2.6 — ABNORMAL HIGH
PT: 25.1 s — ABNORMAL HIGH (ref 9.0–11.5)

## 2020-10-01 NOTE — Progress Notes (Signed)
Pt pre-procedure INR resulted after hours. Will resume INR management with primary team.

## 2020-10-08 ENCOUNTER — Encounter (FREE_STANDING_LABORATORY_FACILITY): Payer: Medicare Other

## 2020-10-08 ENCOUNTER — Telehealth (INDEPENDENT_AMBULATORY_CARE_PROVIDER_SITE_OTHER): Payer: Self-pay

## 2020-10-08 ENCOUNTER — Ambulatory Visit (INDEPENDENT_AMBULATORY_CARE_PROVIDER_SITE_OTHER): Payer: Medicare Other

## 2020-10-08 DIAGNOSIS — I495 Sick sinus syndrome: Secondary | ICD-10-CM

## 2020-10-08 DIAGNOSIS — Z7901 Long term (current) use of anticoagulants: Secondary | ICD-10-CM

## 2020-10-08 LAB — PT/INR
PT INR: 5 — ABNORMAL HIGH (ref 0.9–1.1)
PT: 60.3 s — ABNORMAL HIGH (ref 10.1–12.9)

## 2020-10-08 NOTE — Telephone Encounter (Addendum)
Received call back from RN at patient's [facility] facility regarding INRs. Facility confirms they will f/u with pt's PCP for continued INR management.Margaret Shea

## 2020-10-08 NOTE — Telephone Encounter (Signed)
LMTCB re: previous clinical note to advise that pt will resume INR management with provider who managed it previously as [facility]'s management was only as it related to her procedure completed on 10/01/20.    Spoke with [employer], Aiden, advising of above, who verbalized understanding.

## 2020-10-08 NOTE — Progress Notes (Signed)
Spoke with patient's son (on HIPAA) Thayer Ohm for site check today.  Per Thayer Ohm, patient's incision is intact without redness, swelling or drainage.  He denies seeing any active bleeding or worsening bruising.  Patient denies any s/s of infection.  Reviewed ROM restrictions, as well as hygiene guidance (allowing the water to gently flow over the site, no vigorous scrubbing, pat dry with a clean towel).  No further questions or concerns; warm transferred to front desk to make STJ device check during 1st week of March.

## 2020-10-08 NOTE — Telephone Encounter (Signed)
-----   Message from Deretha Emory V sent at 10/08/2020 11:10 AM EST -----  Regarding: INR  Hi Margaret Shea!    I spoke with this patient's son, Margaret Shea, today for a site check, and he verbally reported that her INR was 5.  He wanted to know what the next steps are as far as medication adjustment for his mom.  Just wanted to give you a heads up if the fax hasn't been sent over yet.    Thank you!

## 2020-10-13 ENCOUNTER — Encounter (INDEPENDENT_AMBULATORY_CARE_PROVIDER_SITE_OTHER): Payer: Self-pay | Admitting: Cardiovascular Disease

## 2020-10-22 ENCOUNTER — Encounter (FREE_STANDING_LABORATORY_FACILITY): Payer: Medicare Other

## 2020-10-22 DIAGNOSIS — I4891 Unspecified atrial fibrillation: Secondary | ICD-10-CM

## 2020-10-22 DIAGNOSIS — Z7901 Long term (current) use of anticoagulants: Secondary | ICD-10-CM

## 2020-10-22 LAB — PT/INR
PT INR: 2.1 — ABNORMAL HIGH (ref 0.9–1.1)
PT: 25.1 s — ABNORMAL HIGH (ref 10.1–12.9)

## 2020-11-05 ENCOUNTER — Encounter (FREE_STANDING_LABORATORY_FACILITY): Payer: Medicare Other

## 2020-11-05 DIAGNOSIS — I4891 Unspecified atrial fibrillation: Secondary | ICD-10-CM

## 2020-11-05 LAB — PT/INR
PT INR: 2.5 — ABNORMAL HIGH (ref 0.9–1.1)
PT: 29.3 s — ABNORMAL HIGH (ref 10.1–12.9)

## 2020-11-12 ENCOUNTER — Ambulatory Visit (INDEPENDENT_AMBULATORY_CARE_PROVIDER_SITE_OTHER): Payer: Medicare Other

## 2020-11-12 DIAGNOSIS — Z950 Presence of cardiac pacemaker: Secondary | ICD-10-CM

## 2020-11-14 ENCOUNTER — Other Ambulatory Visit (INDEPENDENT_AMBULATORY_CARE_PROVIDER_SITE_OTHER): Payer: Self-pay | Admitting: Clinical Cardiac Electrophysiology

## 2020-11-19 ENCOUNTER — Encounter (FREE_STANDING_LABORATORY_FACILITY): Payer: Medicare Other

## 2020-11-19 DIAGNOSIS — I4891 Unspecified atrial fibrillation: Secondary | ICD-10-CM

## 2020-11-19 DIAGNOSIS — Z7901 Long term (current) use of anticoagulants: Secondary | ICD-10-CM

## 2020-11-19 LAB — PT/INR
PT INR: 2.3 — ABNORMAL HIGH (ref 0.9–1.1)
PT: 27.9 s — ABNORMAL HIGH (ref 10.1–12.9)

## 2020-12-13 ENCOUNTER — Encounter (INDEPENDENT_AMBULATORY_CARE_PROVIDER_SITE_OTHER): Payer: Self-pay | Admitting: Cardiovascular Disease

## 2020-12-13 NOTE — Progress Notes (Signed)
[place] [facility] OFF[facility] PROGRESS NOTE    [facility] OFFICE -CARDIOLOGY  [address]  [place] [address]  Dept: [phone]  Dept Fax: [phone]         Patient Name: Margaret Shea    Date of Visit:  December 14, 2020  Date of Birth: [birth year]  AGE: 87 y.o.  Medical Record #: [id number]  Requesting Physician: Talbert Nan, MD      CHIEF COMPLAINT: Follow-up visit    HISTORY OF PRESENT ILLNESS    Ms. Campo  is a very nice 87 year old female with a history of paroxysmal atrial fibrillation maintained on warfarin for anticoagulation (she has rejected the NOACs due to cost), status post [facility] Jude permanent pacemaker for sick sinus syndrome, hyperlipidemia, chronic hyponatremia, and orthostatic hypotension leading to several falls maintained on fludrocortisone who comes in today to establish care and follow-up after being seen in the hospital.  She presented with atypical chest pain with a negative work-up.  She had had a recent PET MPI study on 01/2019 that was normal and a normal echocardiogram in 04/2019.     She recently had her pacemaker generator changed and did well.  Her biggest complaint is overwhelming fatigue.  She just wants to rest.  She does not have any shortness of breath and denies any dizzy spells.  She has not had any known A. fib episodes.  She walks with her walker.  She denies any chest pain, peripheral edema or syncope.  She has had no shortness of breath.  Her blood pressure today is 170/82 and her son states that its been running high in her residence as well.    PAST MEDICAL HISTORY: She has a past medical history of Arrhythmia, Atrial fibrillation, Atrial tachycardia, Chest pain, Constipation, Gastroesophageal reflux disease, Hyperlipidemia, Hypothyroidism, Left arm pain, Pacemaker, PNA (pneumonia) (2016), Prolapse of uterus, SOB (shortness of breath), Tinnitus, Urinary tract infection (12/2015), Ventricular tachycardia, and  Vertigo. She has a past surgical history that includes Back surgery (1980); Mohs surgery; Insert / replace / remove pacemaker (October 2011); Dental surgery; Cardiac catheterization (08/16/2015); Hysterectomy (2004); Adenoidectomy; Tonsillectomy; COLPECTOMY (N/A, 02/07/2016); CYSTOSCOPY (N/A, 02/07/2016); ECHOCARDIOGRAM, TRANSTHORACIC (05/07/2019); and PET MPI Study (01/28/2019).    ALLERGIES:   Allergies   Allergen Reactions   . Flecainide      Tinnitus,    . Propafenone      dizziness       MEDICATIONS:   Current Outpatient Medications   Medication Sig   . calcium carbonate (OS-CAL) 600 MG Tab tablet Take 600 mg by mouth 2 (two) times daily with meals   . Cholecalciferol (VITAMIN D PO) Take 1 tablet by mouth daily   . Cyanocobalamin (VITAMIN B 12) 250 MCG Lozenge Take 1 tablet by mouth daily   . escitalopram (LEXAPRO) 10 MG tablet Take 10 mg by mouth daily   . esomeprazole (NEXIUM) 20 MG capsule Take 20 mg by mouth every morning before breakfast.   . estradiol (ESTRACE) 0.1 MG/GM vaginal cream Place 1 g vaginally twice a week   . famotidine (PEPCID) 20 MG tablet TAKE ONE TABLET BY MOUTH TWICE A DAY   . fludrocortisone (FLORINEF) 0.1 MG tablet Take 0.1 mg by mouth daily     . levothyroxine (SYNTHROID, LEVOTHROID) 50 MCG tablet Take 50 mcg by mouth every morning.       . loratadine (CLARITIN) 10 MG tablet Take 10 mg by mouth daily   . metFORMIN (  FORTAMET) 500 MG (OSM) 24 hr tablet Take 500 mg by mouth every morning with breakfast 1/2 tab po qd   . rosuvastatin (CRESTOR) 20 MG tablet Take 20 mg by mouth daily   . senna-docusate (PERICOLACE) 8.6-50 MG per tablet Take 2 tablets by mouth daily. (Patient taking differently: Take 1 tablet by mouth every other day)   . sodium bicarbonate 650 MG tablet Take 650 mg by mouth 2 (two) times daily   . warfarin (COUMADIN) 1 MG tablet Take 1 mg by mouth Take 1 1/2 tab along with 4 mg tab (total 5.5 mg)   . warfarin (COUMADIN) 4 MG tablet Take 4 mg by mouth   . nitroglycerin  (NITROSTAT) 0.4 MG SL tablet Place 1 tablet (0.4 mg total) under the tongue as needed for Chest pain (q22min x3. call MD or 911 if pain persists).        FAMILY HISTORY: family history includes Coronary artery disease in her father; Hyperlipidemia in her father and sister; Myocardial Infarction in her father.    SOCIAL HISTORY: She reports that she has never smoked. She has never used smokeless tobacco. She reports current alcohol use of about 1.0 standard drink of alcohol per week. She reports that she does not use drugs.     PHYSICAL EXAMINATION    Visit Vitals  Pulse 68   Ht 1.626 m (5\' 4" )   Wt 61.1 kg (134 lb 9.6 oz)   LMP  (LMP Unknown)   SpO2 99%   BMI 23.10 kg/m        General Appearance:  A well-appearing female in no acute distress.  Frail-appearing  Skin: Warm and dry to touch, no apparent skin lesions, or masses noted.  Head: Normocephalic, normal hair pattern, no masses or tenderness   Eyes: EOMS Intact, PERRL, conjunctivae and lids unremarkable.  ENT: Ears, Nose and throat reveal no gross abnormalities.  No pallor or cyanosis.  Neck: JVP normal, no carotid bruit  Chest: Clear to auscultation bilaterally with good air movement and respiratory effort and no wheezes, rales, or rhonchi   Cardiovascular: Regular rhythm, S1 normal, S2 normal, No S3 or S4. No murmur. No gallops or rubs detected   Abdomen: Soft, nontender, nondistended  Extremities: Warm without edema. No clubbing, or cyanosis.   Neuro: Alert and oriented x3. No gross motor or sensory deficits noted, affect appropriate.        LABS:   Lab Results   Component Value Date    WBC 6.0 09/30/2020    HGB 13.3 09/30/2020    HCT 41.8 09/30/2020    PLT 241 09/30/2020     Lab Results   Component Value Date    GLU 210 (H) 09/30/2020    BUN 8 09/30/2020    CREAT 0.78 09/30/2020    NA 141 09/30/2020    K 4.0 09/30/2020    CL 101 09/30/2020    CO2 31 09/30/2020    AST 19 08/13/2020    ALT 20 08/13/2020     Lab Results   Component Value Date    TSH 1.02  05/21/2020    HGBA1C 5.6 08/13/2020     Lab Results   Component Value Date    CHOL 160 05/21/2020    TRIG 120 05/21/2020    HDL 43 05/21/2020    LDL 93 05/21/2020           IMPRESSION:   Ms. Pereyra is a 87 y.o. female with the following problems:  1. Orthostatic hypotension leading to several falls, currently maintained on high-dose fludrocortisone.  She has been hypertensive.  2. Paroxysmal atrial fibrillation maintained on warfarin that is being followed by [facility]  3. Echocardiogram 05/07/1999 with normal LV size and function, EF of 65%, no significant valvular disease  4. PET myocardial perfusion study 01/28/2019 with normal myocardial perfusion and a normal EF of 76%  5. Atypical chest pain has not recurred  6. Sciatica  7. Hyponatremia possibly related to SSRI  8. [employer] permanent pacemaker that may be reaching end-of-life  9. TIA    RECOMMENDATIONS:    1.  In her high blood pressures and lack of symptoms, we will lower her fludrocortisone to 0.1 daily.  She will monitor her blood pressure and symptoms of dizziness.  2.  Continue to follow in our pacemaker clinic.  3.  Recommended that she speak with her PCP concerning possible general blood tests for her fatigue.  4.  She will plan to come back and see me in 6 months or earlier if any symptoms or problems arise.    Thank you so much for including me in her care.                                                 Orders Placed This Encounter   Procedures   . OFFICE VISIT 30 MIN ([facility]RT [place])       No orders of the defined types were placed in this encounter.        SIGNED:    Lynnea Maizes, MD         This note was generated by the Dragon speech recognition and may contain errors or omissions not intended by the user. Grammatical errors, random word insertions, deletions, pronoun errors, and incomplete sentences are occasional consequences of this technology due to software limitations. Not all errors are caught or corrected. If there are questions or  concerns about the content of this note or information contained within the body of this dictation, they should be addressed directly with the author for clarification.

## 2020-12-14 ENCOUNTER — Ambulatory Visit (INDEPENDENT_AMBULATORY_CARE_PROVIDER_SITE_OTHER): Payer: Medicare Other | Admitting: Cardiovascular Disease

## 2020-12-14 VITALS — HR 68 | Ht 64.0 in | Wt 134.6 lb

## 2020-12-14 DIAGNOSIS — I472 Ventricular tachycardia, unspecified: Secondary | ICD-10-CM

## 2020-12-14 DIAGNOSIS — I480 Paroxysmal atrial fibrillation: Secondary | ICD-10-CM

## 2020-12-14 DIAGNOSIS — I471 Supraventricular tachycardia: Secondary | ICD-10-CM

## 2020-12-14 DIAGNOSIS — E785 Hyperlipidemia, unspecified: Secondary | ICD-10-CM

## 2020-12-17 ENCOUNTER — Encounter (FREE_STANDING_LABORATORY_FACILITY): Payer: Medicare Other

## 2020-12-17 DIAGNOSIS — Z7901 Long term (current) use of anticoagulants: Secondary | ICD-10-CM

## 2020-12-17 LAB — PT/INR
PT INR: 2.5 — ABNORMAL HIGH (ref 0.9–1.1)
PT: 29.9 s — ABNORMAL HIGH (ref 10.1–12.9)

## 2020-12-31 ENCOUNTER — Encounter (FREE_STANDING_LABORATORY_FACILITY): Payer: Medicare Other

## 2020-12-31 DIAGNOSIS — R5383 Other fatigue: Secondary | ICD-10-CM

## 2020-12-31 LAB — CBC AND DIFFERENTIAL
Absolute NRBC: 0 10*3/uL (ref 0.00–0.00)
Basophils Absolute Automated: 0.06 10*3/uL (ref 0.00–0.08)
Basophils Automated: 0.8 %
Eosinophils Absolute Automated: 0.39 10*3/uL (ref 0.00–0.44)
Eosinophils Automated: 5.3 %
Hematocrit: 39.3 % (ref 34.7–43.7)
Hgb: 12.7 g/dL (ref 11.4–14.8)
Immature Granulocytes Absolute: 0.03 10*3/uL (ref 0.00–0.07)
Immature Granulocytes: 0.4 %
Lymphocytes Absolute Automated: 2.39 10*3/uL (ref 0.42–3.22)
Lymphocytes Automated: 32.5 %
MCH: 29.4 pg (ref 25.1–33.5)
MCHC: 32.3 g/dL (ref 31.5–35.8)
MCV: 91 fL (ref 78.0–96.0)
MPV: 10.2 fL (ref 8.9–12.5)
Monocytes Absolute Automated: 0.61 10*3/uL (ref 0.21–0.85)
Monocytes: 8.3 %
Neutrophils Absolute: 3.87 10*3/uL (ref 1.10–6.33)
Neutrophils: 52.7 %
Nucleated RBC: 0 /100 WBC (ref 0.0–0.0)
Platelets: 212 10*3/uL (ref 142–346)
RBC: 4.32 10*6/uL (ref 3.90–5.10)
RDW: 14 % (ref 11–15)
WBC: 7.35 10*3/uL (ref 3.10–9.50)

## 2020-12-31 LAB — COMPREHENSIVE METABOLIC PANEL
ALT: 9 U/L (ref 0–55)
AST (SGOT): 14 U/L (ref 5–34)
Albumin/Globulin Ratio: 1.3 (ref 0.9–2.2)
Albumin: 3.7 g/dL (ref 3.5–5.0)
Alkaline Phosphatase: 59 U/L (ref 37–117)
Anion Gap: 10 (ref 5.0–15.0)
BUN: 22 mg/dL — ABNORMAL HIGH (ref 7.0–19.0)
Bilirubin, Total: 0.5 mg/dL (ref 0.2–1.2)
CO2: 28 mEq/L (ref 21–29)
Calcium: 8.7 mg/dL (ref 7.9–10.2)
Chloride: 103 mEq/L (ref 100–111)
Creatinine: 0.8 mg/dL (ref 0.4–1.5)
Globulin: 2.8 g/dL (ref 2.0–3.6)
Glucose: 169 mg/dL — ABNORMAL HIGH (ref 70–100)
Potassium: 3.8 mEq/L (ref 3.5–5.1)
Protein, Total: 6.5 g/dL (ref 6.0–8.3)
Sodium: 141 mEq/L (ref 136–145)

## 2020-12-31 LAB — GFR: EGFR: >60

## 2020-12-31 LAB — TSH: TSH: 2.47 u[IU]/mL (ref 0.35–4.94)

## 2020-12-31 LAB — HEMOGLOBIN A1C
Average Estimated Glucose: 139.9 mg/dL
Hemoglobin A1C: 6.5 % — ABNORMAL HIGH (ref 4.6–5.9)

## 2020-12-31 LAB — MAGNESIUM: Magnesium: 2.1 mg/dL (ref 1.6–2.6)

## 2020-12-31 LAB — HEMOLYSIS INDEX: Hemolysis Index: 16 (ref 0–24)

## 2021-01-14 ENCOUNTER — Encounter (FREE_STANDING_LABORATORY_FACILITY): Payer: Medicare Other

## 2021-01-14 DIAGNOSIS — I4891 Unspecified atrial fibrillation: Secondary | ICD-10-CM

## 2021-01-14 DIAGNOSIS — Z7901 Long term (current) use of anticoagulants: Secondary | ICD-10-CM

## 2021-01-14 LAB — PT/INR
PT INR: 3.2 — ABNORMAL HIGH (ref 0.9–1.1)
PT: 37.9 s — ABNORMAL HIGH (ref 10.1–12.9)

## 2021-01-28 ENCOUNTER — Encounter (FREE_STANDING_LABORATORY_FACILITY): Payer: Medicare Other

## 2021-01-28 DIAGNOSIS — Z7901 Long term (current) use of anticoagulants: Secondary | ICD-10-CM

## 2021-01-28 LAB — PT/INR
PT INR: 2.3 — ABNORMAL HIGH (ref 0.9–1.1)
PT: 27.1 s — ABNORMAL HIGH (ref 10.1–12.9)

## 2021-02-11 ENCOUNTER — Encounter (FREE_STANDING_LABORATORY_FACILITY): Payer: Medicare Other

## 2021-02-11 DIAGNOSIS — Z7901 Long term (current) use of anticoagulants: Secondary | ICD-10-CM

## 2021-02-11 LAB — PT/INR
PT INR: 1.9 — ABNORMAL HIGH (ref 0.9–1.1)
PT: 22.5 s — ABNORMAL HIGH (ref 10.1–12.9)

## 2021-03-11 ENCOUNTER — Encounter (FREE_STANDING_LABORATORY_FACILITY): Payer: Medicare Other

## 2021-03-11 DIAGNOSIS — Z7901 Long term (current) use of anticoagulants: Secondary | ICD-10-CM

## 2021-03-11 LAB — PT/INR
PT INR: 1.8 — ABNORMAL HIGH (ref 0.9–1.1)
PT: 21.4 s — ABNORMAL HIGH (ref 10.1–12.9)

## 2021-03-25 ENCOUNTER — Encounter (FREE_STANDING_LABORATORY_FACILITY): Payer: Medicare Other

## 2021-03-25 DIAGNOSIS — I4891 Unspecified atrial fibrillation: Secondary | ICD-10-CM

## 2021-03-25 LAB — PT/INR
PT INR: 2.6 — ABNORMAL HIGH (ref 0.9–1.1)
PT: 31.2 s — ABNORMAL HIGH (ref 10.1–12.9)

## 2021-04-08 ENCOUNTER — Encounter (FREE_STANDING_LABORATORY_FACILITY): Payer: Medicare Other

## 2021-04-08 DIAGNOSIS — Z7901 Long term (current) use of anticoagulants: Secondary | ICD-10-CM

## 2021-04-08 LAB — PT/INR
PT INR: 3.8 — ABNORMAL HIGH (ref 0.9–1.1)
PT: 45 s — ABNORMAL HIGH (ref 10.1–12.9)

## 2021-05-06 ENCOUNTER — Encounter (FREE_STANDING_LABORATORY_FACILITY): Payer: Medicare Other

## 2021-05-06 DIAGNOSIS — E039 Hypothyroidism, unspecified: Secondary | ICD-10-CM

## 2021-05-06 DIAGNOSIS — Z7901 Long term (current) use of anticoagulants: Secondary | ICD-10-CM

## 2021-05-06 LAB — PT/INR
PT INR: 2.9 — ABNORMAL HIGH (ref 0.9–1.1)
PT: 34.7 s — ABNORMAL HIGH (ref 10.1–12.9)

## 2021-05-06 LAB — T4, FREE: T4 Free: 1.16 ng/dL (ref 0.69–1.48)

## 2021-05-06 LAB — TSH: TSH: 1.78 u[IU]/mL (ref 0.35–4.94)

## 2021-05-06 LAB — PTH, INTACT: PTH Intact: 38.3 pg/mL (ref 17.7–84.5)

## 2021-05-20 ENCOUNTER — Encounter (FREE_STANDING_LABORATORY_FACILITY): Payer: Medicare Other

## 2021-05-20 DIAGNOSIS — D519 Vitamin B12 deficiency anemia, unspecified: Secondary | ICD-10-CM

## 2021-05-20 DIAGNOSIS — E119 Type 2 diabetes mellitus without complications: Secondary | ICD-10-CM

## 2021-05-20 DIAGNOSIS — E039 Hypothyroidism, unspecified: Secondary | ICD-10-CM

## 2021-05-20 DIAGNOSIS — E559 Vitamin D deficiency, unspecified: Secondary | ICD-10-CM

## 2021-05-20 LAB — VITAMIN B12: Vitamin B-12: 442 pg/mL (ref 211–911)

## 2021-05-20 LAB — COMPREHENSIVE METABOLIC PANEL
ALT: 14 U/L (ref 0–55)
AST (SGOT): 19 U/L (ref 5–41)
Albumin/Globulin Ratio: 1.5 (ref 0.9–2.2)
Albumin: 3.7 g/dL (ref 3.5–5.0)
Alkaline Phosphatase: 66 U/L (ref 37–117)
Anion Gap: 9 (ref 5.0–15.0)
BUN: 12 mg/dL (ref 7.0–21.0)
Bilirubin, Total: 0.6 mg/dL (ref 0.2–1.2)
CO2: 27 mEq/L (ref 17–29)
Calcium: 8.7 mg/dL (ref 7.9–10.2)
Chloride: 102 mEq/L (ref 99–111)
Creatinine: 0.8 mg/dL (ref 0.4–1.0)
Globulin: 2.5 g/dL (ref 2.0–3.6)
Glucose: 159 mg/dL — ABNORMAL HIGH (ref 70–100)
Potassium: 4.2 mEq/L (ref 3.5–5.3)
Protein, Total: 6.2 g/dL (ref 6.0–8.3)
Sodium: 138 mEq/L (ref 135–145)

## 2021-05-20 LAB — CBC
Absolute NRBC: 0 10*3/uL (ref 0.00–0.00)
Hematocrit: 38.3 % (ref 34.7–43.7)
Hgb: 12.4 g/dL (ref 11.4–14.8)
MCH: 28.8 pg (ref 25.1–33.5)
MCHC: 32.4 g/dL (ref 31.5–35.8)
MCV: 88.9 fL (ref 78.0–96.0)
MPV: 10 fL (ref 8.9–12.5)
Nucleated RBC: 0 /100 WBC (ref 0.0–0.0)
Platelets: 207 10*3/uL (ref 142–346)
RBC: 4.31 10*6/uL (ref 3.90–5.10)
RDW: 14 % (ref 11–15)
WBC: 6.78 10*3/uL (ref 3.10–9.50)

## 2021-05-20 LAB — CK: Creatine Kinase (CK): 56 U/L (ref 29–233)

## 2021-05-20 LAB — LIPID PANEL
Cholesterol / HDL Ratio: 4.3 Index
Cholesterol: 170 mg/dL (ref 0–199)
HDL: 40 mg/dL (ref 40–9999)
LDL Calculated: 103 mg/dL — ABNORMAL HIGH (ref 0–99)
Triglycerides: 135 mg/dL (ref 34–149)
VLDL Calculated: 27 mg/dL (ref 10–40)

## 2021-05-20 LAB — HEMOGLOBIN A1C
Average Estimated Glucose: 168.6 mg/dL
Hemoglobin A1C: 7.5 % — ABNORMAL HIGH (ref 4.6–5.9)

## 2021-05-20 LAB — C-REACTIVE PROTEIN: C-Reactive Protein: 0.1 mg/dL (ref 0.0–1.1)

## 2021-05-20 LAB — SEDIMENTATION RATE: Sed Rate: 10 mm/Hr (ref 0–20)

## 2021-05-20 LAB — GFR: EGFR: >60

## 2021-05-20 LAB — HEMOLYSIS INDEX: Hemolysis Index: 4 Index (ref 0–24)

## 2021-05-20 LAB — MAGNESIUM: Magnesium: 2 mg/dL (ref 1.6–2.6)

## 2021-05-20 LAB — VITAMIN D,25 OH,TOTAL: Vitamin D, 25 OH, Total: 35 ng/mL (ref 30–100)

## 2021-05-20 LAB — T4, FREE: T4 Free: 1.14 ng/dL (ref 0.69–1.48)

## 2021-05-20 LAB — TSH: TSH: 1.95 u[IU]/mL (ref 0.35–4.94)

## 2021-06-03 ENCOUNTER — Encounter (FREE_STANDING_LABORATORY_FACILITY): Payer: Medicare Other

## 2021-06-03 DIAGNOSIS — Z791 Long term (current) use of non-steroidal anti-inflammatories (NSAID): Secondary | ICD-10-CM

## 2021-06-03 DIAGNOSIS — Z7901 Long term (current) use of anticoagulants: Secondary | ICD-10-CM

## 2021-06-03 LAB — PT/INR
PT INR: 4 — ABNORMAL HIGH (ref 0.9–1.1)
PT: 47.7 s — ABNORMAL HIGH (ref 10.1–12.9)

## 2021-06-13 ENCOUNTER — Encounter (INDEPENDENT_AMBULATORY_CARE_PROVIDER_SITE_OTHER): Payer: Self-pay | Admitting: Cardiovascular Disease

## 2021-06-13 NOTE — Progress Notes (Signed)
[place] [facility] OFF[facility] PROGRESS NOTE    [facility]RT [facility] OFFICE -CARDIOLOGY  [address]  [place] [address]  Dept: [phone]  Dept Fax: [phone]         Patient Name: Margaret Shea    Date of Visit:  June 15, 2021  Date of Birth: 1932/02/26  AGE: 87 y.o.  Medical Record #: [id number]  Requesting Physician: Talbert Nan, MD      CHIEF COMPLAINT: Follow-up visit    HISTORY OF PRESENT ILLNESS    Ms. Spranger  is a very nice 87 year old female with a history of paroxysmal atrial fibrillation maintained on warfarin for anticoagulation (she has rejected the NOACs due to cost), status post [facility] Jude permanent pacemaker for sick sinus syndrome, hyperlipidemia, chronic hyponatremia, and orthostatic hypotension leading to several falls maintained on fludrocortisone who comes in today for follow-up.  She has generally been feeling pretty well.  She does get a little lightheaded when she gets up too quickly but has had no falls or syncope.  She also had a more extensive episode of atrial fibrillation yesterday that ended up resolving on its own.  She recently had a pacemaker check that showed she was in A. fib about 2% of the time.  She had had over 2800 episodes of A. fib.  They are usually quite brief.    Today in the office her blood pressure was 132/72 with a heart rate of 71.  EKG shows an atrial paced rhythm and nonspecific T wave flattening.    PAST MEDICAL HISTORY: She has a past medical history of Arrhythmia, Atrial fibrillation, Atrial tachycardia, Chest pain, Constipation, Gastroesophageal reflux disease, Hyperlipidemia, Hypothyroidism, Left arm pain, Pacemaker, PNA (pneumonia) (2016), Prolapse of uterus, SOB (shortness of breath), Tinnitus, Urinary tract infection (12/2015), Ventricular tachycardia, and Vertigo. She has a past surgical history that includes Back surgery (1980); Mohs surgery; Insert / replace / remove pacemaker (October 2011);  Dental surgery; Cardiac catheterization (08/16/2015); Hysterectomy (2004); Adenoidectomy; Tonsillectomy; COLPECTOMY (N/A, 02/07/2016); CYSTOSCOPY (N/A, 02/07/2016); ECHOCARDIOGRAM, TRANSTHORACIC (05/07/2019); and PET MPI Study (01/28/2019).    ALLERGIES:   Allergies   Allergen Reactions    Flecainide      Tinnitus,     Propafenone      dizziness       MEDICATIONS:     Current Outpatient Medications:     calcium carbonate (OS-CAL) 600 MG Tab tablet, Take 600 mg by mouth 2 (two) times daily with meals, Disp: , Rfl:     Cholecalciferol (VITAMIN D PO), Take 1 tablet by mouth daily, Disp: , Rfl:     Cyanocobalamin (VITAMIN B 12) 250 MCG Lozenge, Take 1 tablet by mouth daily, Disp: , Rfl:     escitalopram (LEXAPRO) 10 MG tablet, Take 10 mg by mouth daily, Disp: , Rfl:     esomeprazole (NEXIUM) 20 MG capsule, Take 20 mg by mouth every morning before breakfast., Disp: , Rfl:     estradiol (ESTRACE) 0.1 MG/GM vaginal cream, Place 1 g vaginally twice a week, Disp: , Rfl:     famotidine (PEPCID) 20 MG tablet, TAKE ONE TABLET BY MOUTH TWICE A DAY, Disp: 180 tablet, Rfl: 3    fludrocortisone (FLORINEF) 0.1 MG tablet, Take 0.1 mg by mouth daily , Disp: , Rfl:     levothyroxine (SYNTHROID, LEVOTHROID) 50 MCG tablet, Take 50 mcg by mouth every morning.  , Disp: , Rfl:     loratadine (CLARITIN) 10 MG tablet, Take  10 mg by mouth daily, Disp: , Rfl:     metFORMIN (FORTAMET) 500 MG (OSM) 24 hr tablet, Take 500 mg by mouth every morning with breakfast 1/2 tab po qd, Disp: , Rfl:     nitroglycerin (NITROSTAT) 0.4 MG SL tablet, Place 1 tablet (0.4 mg total) under the tongue as needed for Chest pain (q32min x3. call MD or 911 if pain persists)., Disp: 25 tablet, Rfl: 3    rosuvastatin (CRESTOR) 20 MG tablet, Take 20 mg by mouth daily, Disp: , Rfl:     senna-docusate (PERICOLACE) 8.6-50 MG per tablet, Take 2 tablets by mouth daily. (Patient taking differently: Take 1 tablet by mouth every other day), Disp: 30 tablet, Rfl: 0    sodium  bicarbonate 650 MG tablet, Take 650 mg by mouth 2 (two) times daily, Disp: , Rfl:     warfarin (COUMADIN) 1 MG tablet, Take 1 mg by mouth Take 1 1/2 tab along with 4 mg tab (total 5.5 mg), Disp: , Rfl:     warfarin (COUMADIN) 4 MG tablet, Take 4 mg by mouth, Disp: , Rfl:        FAMILY HISTORY: family history includes Coronary artery disease in her father; Hyperlipidemia in her father and sister; Myocardial Infarction in her father.    SOCIAL HISTORY: She reports that she has never smoked. She has never used smokeless tobacco. She reports current alcohol use of about 1.0 standard drink per week. She reports that she does not use drugs.     PHYSICAL EXAMINATION    Visit Vitals  BP 132/72 (BP Site: Right arm, Patient Position: Sitting, Cuff Size: Medium)   Pulse 71   Ht 1.6 m (5\' 3" )   Wt 64.9 kg (143 lb)   LMP  (LMP Unknown)   BMI 25.33 kg/m        General Appearance:  A well-appearing female in no acute distress.  Frail-appearing  Skin: Warm and dry to touch, no apparent skin lesions, or masses noted.  Head: Normocephalic, normal hair pattern, no masses or tenderness   Eyes: EOMS Intact, PERRL, conjunctivae and lids unremarkable.  ENT: Ears, Nose and throat reveal no gross abnormalities.  No pallor or cyanosis.  Neck: JVP normal, no carotid bruit  Chest: Clear to auscultation bilaterally with good air movement and respiratory effort and no wheezes, rales, or rhonchi   Cardiovascular: Regular rhythm, S1 normal, S2 normal, No S3 or S4. No murmur. No gallops or rubs detected   Abdomen: Soft, nontender, nondistended  Extremities: Warm without edema. No clubbing, or cyanosis.   Neuro: Alert and oriented x3. No gross motor or sensory deficits noted, affect appropriate.        LABS:   Lab Results   Component Value Date    WBC 6.78 05/20/2021    HGB 12.4 05/20/2021    HCT 38.3 05/20/2021    PLT 207 05/20/2021     Lab Results   Component Value Date    GLU 159 (H) 05/20/2021    BUN 12.0 05/20/2021    CREAT 0.8 05/20/2021     NA 138 05/20/2021    K 4.2 05/20/2021    CL 102 05/20/2021    CO2 27 05/20/2021    AST 19 05/20/2021    ALT 14 05/20/2021     Lab Results   Component Value Date    MG 2.0 05/20/2021    TSH 1.95 05/20/2021    HGBA1C 7.5 (H) 05/20/2021     Lab Results  Component Value Date    CHOL 170 05/20/2021    TRIG 135 05/20/2021    HDL 40 05/20/2021    LDL 103 (H) 05/20/2021           IMPRESSION:   Ms. Simoneau is a 87 y.o. female with the following problems:    Orthostatic hypotension leading to several falls, currently maintained on  fludrocortisone.  She has not had any significant falls.    Paroxysmal atrial fibrillation maintained on warfarin that is being followed by [facility], more extensive episode of A. fib yesterday but she is in A. fib about 2% of the time according to her most recent pacemaker check.  Echocardiogram 05/07/1999 with normal LV size and function, EF of 65%, no significant valvular disease  PET myocardial perfusion study 01/28/2019 with normal myocardial perfusion and a normal EF of 76%  Atypical chest pain has not recurred  Sciatica  Hyponatremia possibly related to SSRI  [facility] permanent pacemaker followed by.    TIA    RECOMMENDATIONS:    1.  She will continue on her current medical therapy.  I have given her prescription to use Lopressor as needed if she goes into A. fib.  We went over the instructions for this use.  2.  She will continue to follow with [facility] and for her pacemaker.  3.  Recommended that she speak with her PCP concerning possible general blood tests for her fatigue.  4.  She will plan to come back and see [place] in 6 months or earlier if any symptoms or problems arise.    Thank you so much for including me in her care.                                                 Orders Placed This Encounter   Procedures    ECG 12 lead (Normal)    APP Office Visit (HRT [place])         No orders of the defined types were placed in this encounter.        SIGNED:    Lynnea Maizes, MD         This note  was generated by the Dragon speech recognition and may contain errors or omissions not intended by the user. Grammatical errors, random word insertions, deletions, pronoun errors, and incomplete sentences are occasional consequences of this technology due to software limitations. Not all errors are caught or corrected. If there are questions or concerns about the content of this note or information contained within the body of this dictation, they should be addressed directly with the author for clarification.

## 2021-06-15 ENCOUNTER — Ambulatory Visit (INDEPENDENT_AMBULATORY_CARE_PROVIDER_SITE_OTHER): Payer: Medicare Other | Admitting: Cardiovascular Disease

## 2021-06-15 ENCOUNTER — Encounter (INDEPENDENT_AMBULATORY_CARE_PROVIDER_SITE_OTHER): Payer: Self-pay | Admitting: Cardiovascular Disease

## 2021-06-15 VITALS — BP 132/72 | HR 71 | Ht 63.0 in | Wt 143.0 lb

## 2021-06-15 DIAGNOSIS — I480 Paroxysmal atrial fibrillation: Secondary | ICD-10-CM

## 2021-06-15 DIAGNOSIS — E785 Hyperlipidemia, unspecified: Secondary | ICD-10-CM

## 2021-06-15 DIAGNOSIS — I471 Supraventricular tachycardia: Secondary | ICD-10-CM

## 2021-06-15 DIAGNOSIS — I472 Ventricular tachycardia, unspecified: Secondary | ICD-10-CM

## 2021-06-17 ENCOUNTER — Encounter (FREE_STANDING_LABORATORY_FACILITY): Payer: Medicare Other

## 2021-06-17 DIAGNOSIS — I4891 Unspecified atrial fibrillation: Secondary | ICD-10-CM

## 2021-06-17 DIAGNOSIS — Z7901 Long term (current) use of anticoagulants: Secondary | ICD-10-CM

## 2021-06-17 LAB — PT/INR
PT INR: 2.1 — ABNORMAL HIGH (ref 0.9–1.1)
PT: 24.5 s — ABNORMAL HIGH (ref 10.1–12.9)

## 2021-06-23 ENCOUNTER — Telehealth (INDEPENDENT_AMBULATORY_CARE_PROVIDER_SITE_OTHER): Payer: Self-pay

## 2021-06-23 NOTE — Telephone Encounter (Signed)
Received call from [facility] from [facility] requiring more information on metoprolol rx that was written and brought in. They would like to clarify dose/ tartrate vs succinate before filling rx.

## 2021-06-30 ENCOUNTER — Encounter (INDEPENDENT_AMBULATORY_CARE_PROVIDER_SITE_OTHER): Payer: Self-pay | Admitting: Cardiovascular Disease

## 2021-06-30 ENCOUNTER — Other Ambulatory Visit (INDEPENDENT_AMBULATORY_CARE_PROVIDER_SITE_OTHER): Payer: Self-pay

## 2021-06-30 MED ORDER — METOPROLOL TARTRATE 25 MG PO TABS
25.0000 mg | ORAL_TABLET | ORAL | 3 refills | Status: DC | PRN
Start: 2021-06-30 — End: 2022-12-19

## 2021-06-30 NOTE — Telephone Encounter (Signed)
[facility] sent for metoprolol tartrate prn for afib per Dr Luster Landsberg.

## 2021-07-01 ENCOUNTER — Encounter (FREE_STANDING_LABORATORY_FACILITY): Payer: Medicare Other

## 2021-07-01 DIAGNOSIS — I482 Chronic atrial fibrillation, unspecified: Secondary | ICD-10-CM

## 2021-07-01 LAB — PT/INR
PT INR: 2.9 — ABNORMAL HIGH (ref 0.9–1.1)
PT: 35 s — ABNORMAL HIGH (ref 10.1–12.9)

## 2021-07-22 ENCOUNTER — Encounter (FREE_STANDING_LABORATORY_FACILITY): Payer: Medicare Other

## 2021-07-22 DIAGNOSIS — E871 Hypo-osmolality and hyponatremia: Secondary | ICD-10-CM

## 2021-07-22 DIAGNOSIS — E139 Other specified diabetes mellitus without complications: Secondary | ICD-10-CM

## 2021-07-22 DIAGNOSIS — R5383 Other fatigue: Secondary | ICD-10-CM

## 2021-07-22 LAB — CBC AND DIFFERENTIAL
Absolute NRBC: 0 10*3/uL (ref 0.00–0.00)
Basophils Absolute Automated: 0.06 10*3/uL (ref 0.00–0.08)
Basophils Automated: 0.8 %
Eosinophils Absolute Automated: 0.39 10*3/uL (ref 0.00–0.44)
Eosinophils Automated: 5.3 %
Hematocrit: 40.6 % (ref 34.7–43.7)
Hgb: 13.1 g/dL (ref 11.4–14.8)
Immature Granulocytes Absolute: 0.03 10*3/uL (ref 0.00–0.07)
Immature Granulocytes: 0.4 %
Lymphocytes Absolute Automated: 2.55 10*3/uL (ref 0.42–3.22)
Lymphocytes Automated: 34.4 %
MCH: 29.4 pg (ref 25.1–33.5)
MCHC: 32.3 g/dL (ref 31.5–35.8)
MCV: 91.2 fL (ref 78.0–96.0)
MPV: 10.2 fL (ref 8.9–12.5)
Monocytes Absolute Automated: 0.56 10*3/uL (ref 0.21–0.85)
Monocytes: 7.5 %
Neutrophils Absolute: 3.83 10*3/uL (ref 1.10–6.33)
Neutrophils: 51.6 %
Nucleated RBC: 0 /100 WBC (ref 0.0–0.0)
Platelets: 209 10*3/uL (ref 142–346)
RBC: 4.45 10*6/uL (ref 3.90–5.10)
RDW: 14 % (ref 11–15)
WBC: 7.42 10*3/uL (ref 3.10–9.50)

## 2021-07-22 LAB — COMPREHENSIVE METABOLIC PANEL
ALT: 14 U/L (ref 0–55)
AST (SGOT): 16 U/L (ref 5–41)
Albumin/Globulin Ratio: 1.5 (ref 0.9–2.2)
Albumin: 4 g/dL (ref 3.5–5.0)
Alkaline Phosphatase: 67 U/L (ref 37–117)
Anion Gap: 7 (ref 5.0–15.0)
BUN: 10 mg/dL (ref 7.0–21.0)
Bilirubin, Total: 0.6 mg/dL (ref 0.2–1.2)
CO2: 29 mEq/L (ref 17–29)
Calcium: 9.3 mg/dL (ref 7.9–10.2)
Chloride: 104 mEq/L (ref 99–111)
Creatinine: 0.9 mg/dL (ref 0.4–1.0)
Globulin: 2.7 g/dL (ref 2.0–3.6)
Glucose: 195 mg/dL — ABNORMAL HIGH (ref 70–100)
Potassium: 4 mEq/L (ref 3.5–5.3)
Protein, Total: 6.7 g/dL (ref 6.0–8.3)
Sodium: 140 mEq/L (ref 135–145)

## 2021-07-22 LAB — HEMOLYSIS INDEX: Hemolysis Index: 3 Index (ref 0–24)

## 2021-07-22 LAB — GFR: EGFR: 59.2

## 2021-07-22 LAB — HEMOGLOBIN A1C
Average Estimated Glucose: 177.2 mg/dL
Hemoglobin A1C: 7.8 % — ABNORMAL HIGH (ref 4.6–5.9)

## 2021-07-28 ENCOUNTER — Encounter (FREE_STANDING_LABORATORY_FACILITY): Payer: Medicare Other

## 2021-07-28 DIAGNOSIS — Z7901 Long term (current) use of anticoagulants: Secondary | ICD-10-CM

## 2021-07-28 DIAGNOSIS — I482 Chronic atrial fibrillation, unspecified: Secondary | ICD-10-CM

## 2021-07-29 ENCOUNTER — Encounter (FREE_STANDING_LABORATORY_FACILITY): Payer: Medicare Other

## 2021-07-29 DIAGNOSIS — Z7901 Long term (current) use of anticoagulants: Secondary | ICD-10-CM

## 2021-07-29 DIAGNOSIS — I482 Chronic atrial fibrillation, unspecified: Secondary | ICD-10-CM

## 2021-07-29 LAB — URINALYSIS, REFLEX TO MICROSCOPIC EXAM IF INDICATED
Bilirubin, UA: NEGATIVE
Ketones UA: NEGATIVE
Nitrite, UA: NEGATIVE
Specific Gravity UA: 1.014 (ref 1.001–1.035)
Urine pH: 7.5 (ref 5.0–8.0)
Urobilinogen, UA: NORMAL mg/dL

## 2021-07-29 LAB — PT/INR
PT INR: 2.2 — ABNORMAL HIGH (ref 0.9–1.1)
PT: 26.1 s — ABNORMAL HIGH (ref 10.1–12.9)

## 2021-08-04 ENCOUNTER — Encounter (FREE_STANDING_LABORATORY_FACILITY): Payer: Medicare Other

## 2021-08-04 DIAGNOSIS — N390 Urinary tract infection, site not specified: Secondary | ICD-10-CM

## 2021-08-05 LAB — URINALYSIS, REFLEX TO MICROSCOPIC EXAM IF INDICATED
Bilirubin, UA: NEGATIVE
Blood, UA: NEGATIVE
Ketones UA: NEGATIVE
Leukocyte Esterase, UA: NEGATIVE
Nitrite, UA: NEGATIVE
Protein, UR: NEGATIVE
Specific Gravity UA: 1.01 (ref 1.001–1.035)
Urine pH: 7 (ref 5.0–8.0)
Urobilinogen, UA: NORMAL mg/dL

## 2021-09-02 ENCOUNTER — Encounter (FREE_STANDING_LABORATORY_FACILITY): Payer: Medicare Other

## 2021-09-02 DIAGNOSIS — Z7901 Long term (current) use of anticoagulants: Secondary | ICD-10-CM

## 2021-09-02 LAB — PT/INR
PT INR: 3.2 — ABNORMAL HIGH (ref 0.9–1.1)
PT: 38 s — ABNORMAL HIGH (ref 10.1–12.9)

## 2021-09-16 ENCOUNTER — Encounter (FREE_STANDING_LABORATORY_FACILITY): Payer: Medicare Other

## 2021-09-16 DIAGNOSIS — I482 Chronic atrial fibrillation, unspecified: Secondary | ICD-10-CM

## 2021-09-16 DIAGNOSIS — Z7901 Long term (current) use of anticoagulants: Secondary | ICD-10-CM

## 2021-09-16 LAB — PT/INR
PT INR: 2.6 — ABNORMAL HIGH (ref 0.9–1.1)
PT: 31 s — ABNORMAL HIGH (ref 10.1–12.9)

## 2021-09-23 ENCOUNTER — Encounter (FREE_STANDING_LABORATORY_FACILITY): Payer: Medicare Other

## 2021-09-23 DIAGNOSIS — I4891 Unspecified atrial fibrillation: Secondary | ICD-10-CM

## 2021-09-23 DIAGNOSIS — Z7901 Long term (current) use of anticoagulants: Secondary | ICD-10-CM

## 2021-09-23 LAB — PT/INR
PT INR: 3.1 — ABNORMAL HIGH (ref 0.9–1.1)
PT: 36.8 s — ABNORMAL HIGH (ref 10.1–12.9)

## 2021-09-30 ENCOUNTER — Encounter (FREE_STANDING_LABORATORY_FACILITY): Payer: Medicare Other

## 2021-09-30 DIAGNOSIS — Z7901 Long term (current) use of anticoagulants: Secondary | ICD-10-CM

## 2021-09-30 DIAGNOSIS — R5383 Other fatigue: Secondary | ICD-10-CM

## 2021-09-30 LAB — PT/INR
PT INR: 2.3 — ABNORMAL HIGH (ref 0.9–1.1)
PT: 27.3 s — ABNORMAL HIGH (ref 10.1–12.9)

## 2021-09-30 LAB — URINALYSIS, REFLEX TO MICROSCOPIC EXAM IF INDICATED
Bilirubin, UA: NEGATIVE
Glucose, UA: >=1000 — AB
Ketones UA: NEGATIVE
Leukocyte Esterase, UA: NEGATIVE
Nitrite, UA: NEGATIVE
Protein, UR: NEGATIVE
Specific Gravity UA: 1.024 (ref 1.001–1.035)
Urine pH: 7 (ref 5.0–8.0)
Urobilinogen, UA: NORMAL mg/dL

## 2021-10-14 ENCOUNTER — Encounter (FREE_STANDING_LABORATORY_FACILITY): Payer: Medicare Other

## 2021-10-14 DIAGNOSIS — I4891 Unspecified atrial fibrillation: Secondary | ICD-10-CM

## 2021-10-14 LAB — PT/INR
PT INR: 1.6 — ABNORMAL HIGH (ref 0.9–1.1)
PT: 19.2 s — ABNORMAL HIGH (ref 10.1–12.9)

## 2021-11-04 ENCOUNTER — Encounter (FREE_STANDING_LABORATORY_FACILITY): Payer: Medicare Other

## 2021-11-04 DIAGNOSIS — Z7901 Long term (current) use of anticoagulants: Secondary | ICD-10-CM

## 2021-11-04 LAB — PT/INR
PT INR: 2.5 — ABNORMAL HIGH (ref 0.9–1.1)
PT: 29.8 s — ABNORMAL HIGH (ref 10.1–12.9)

## 2021-11-11 ENCOUNTER — Ambulatory Visit (INDEPENDENT_AMBULATORY_CARE_PROVIDER_SITE_OTHER): Payer: Medicare Other

## 2021-11-11 DIAGNOSIS — Z45018 Encounter for adjustment and management of other part of cardiac pacemaker: Secondary | ICD-10-CM

## 2021-11-11 DIAGNOSIS — Z950 Presence of cardiac pacemaker: Secondary | ICD-10-CM

## 2021-11-12 ENCOUNTER — Other Ambulatory Visit (INDEPENDENT_AMBULATORY_CARE_PROVIDER_SITE_OTHER): Payer: Self-pay | Admitting: Cardiovascular Disease

## 2021-11-12 LAB — IN OFFICE CARDIAC DEVICE MONITORING: RV Pacing Percentage: 0.08

## 2021-11-18 ENCOUNTER — Encounter (FREE_STANDING_LABORATORY_FACILITY): Payer: Medicare Other

## 2021-11-18 DIAGNOSIS — E119 Type 2 diabetes mellitus without complications: Secondary | ICD-10-CM

## 2021-11-18 DIAGNOSIS — R351 Nocturia: Secondary | ICD-10-CM

## 2021-11-18 DIAGNOSIS — Z7901 Long term (current) use of anticoagulants: Secondary | ICD-10-CM

## 2021-11-18 DIAGNOSIS — I4891 Unspecified atrial fibrillation: Secondary | ICD-10-CM

## 2021-11-18 LAB — CBC AND DIFFERENTIAL
Absolute NRBC: 0 10*3/uL (ref 0.00–0.00)
Basophils Absolute Automated: 0.07 10*3/uL (ref 0.00–0.08)
Basophils Automated: 0.9 %
Eosinophils Absolute Automated: 0.38 10*3/uL (ref 0.00–0.44)
Eosinophils Automated: 4.9 %
Hematocrit: 41.1 % (ref 34.7–43.7)
Hgb: 13.5 g/dL (ref 11.4–14.8)
Immature Granulocytes Absolute: 0.02 10*3/uL (ref 0.00–0.07)
Immature Granulocytes: 0.3 %
Instrument Absolute Neutrophil Count: 4.72 10*3/uL (ref 1.10–6.33)
Lymphocytes Absolute Automated: 1.94 10*3/uL (ref 0.42–3.22)
Lymphocytes Automated: 24.9 %
MCH: 29.1 pg (ref 25.1–33.5)
MCHC: 32.8 g/dL (ref 31.5–35.8)
MCV: 88.6 fL (ref 78.0–96.0)
MPV: 10.4 fL (ref 8.9–12.5)
Monocytes Absolute Automated: 0.65 10*3/uL (ref 0.21–0.85)
Monocytes: 8.4 %
Neutrophils Absolute: 4.72 10*3/uL (ref 1.10–6.33)
Neutrophils: 60.6 %
Nucleated RBC: 0 /100 WBC (ref 0.0–0.0)
Platelets: 191 10*3/uL (ref 142–346)
RBC: 4.64 10*6/uL (ref 3.90–5.10)
RDW: 14 % (ref 11–15)
WBC: 7.78 10*3/uL (ref 3.10–9.50)

## 2021-11-18 LAB — COMPREHENSIVE METABOLIC PANEL
ALT: 10 U/L (ref 0–55)
AST (SGOT): 15 U/L (ref 5–41)
Albumin/Globulin Ratio: 1.3 (ref 0.9–2.2)
Albumin: 3.6 g/dL (ref 3.5–5.0)
Alkaline Phosphatase: 73 U/L (ref 37–117)
Anion Gap: 9 (ref 5.0–15.0)
BUN: 8 mg/dL (ref 7.0–21.0)
Bilirubin, Total: 0.6 mg/dL (ref 0.2–1.2)
CO2: 26 mEq/L (ref 17–29)
Calcium: 8.8 mg/dL (ref 7.9–10.2)
Chloride: 100 mEq/L (ref 99–111)
Creatinine: 0.9 mg/dL (ref 0.4–1.0)
Globulin: 2.8 g/dL (ref 2.0–3.6)
Glucose: 429 mg/dL — ABNORMAL HIGH (ref 70–100)
Potassium: 3.6 mEq/L (ref 3.5–5.3)
Protein, Total: 6.4 g/dL (ref 6.0–8.3)
Sodium: 135 mEq/L (ref 135–145)

## 2021-11-18 LAB — GFR: EGFR: 59.2

## 2021-11-18 LAB — URINALYSIS, REFLEX TO MICROSCOPIC EXAM IF INDICATED
Bilirubin, UA: NEGATIVE
Blood, UA: NEGATIVE
Glucose, UA: >=1000 — AB
Ketones UA: NEGATIVE
Leukocyte Esterase, UA: NEGATIVE
Nitrite, UA: NEGATIVE
Protein, UR: NEGATIVE
Specific Gravity UA: 1.039 — AB (ref 1.001–1.035)
Urine pH: 7 (ref 5.0–8.0)
Urobilinogen, UA: NORMAL mg/dL

## 2021-11-18 LAB — PT/INR
PT INR: 2.6 — ABNORMAL HIGH (ref 0.9–1.1)
PT: 30.8 s — ABNORMAL HIGH (ref 10.1–12.9)

## 2021-11-18 LAB — HEMOLYSIS INDEX: Hemolysis Index: 4 Index (ref 0–24)

## 2021-11-18 LAB — HEMOGLOBIN A1C
Average Estimated Glucose: 312.1 mg/dL
Hemoglobin A1C: 12.5 % — ABNORMAL HIGH (ref 4.6–5.9)

## 2021-11-25 ENCOUNTER — Encounter (FREE_STANDING_LABORATORY_FACILITY): Payer: Medicare Other

## 2021-11-25 DIAGNOSIS — R350 Frequency of micturition: Secondary | ICD-10-CM

## 2021-11-25 DIAGNOSIS — E119 Type 2 diabetes mellitus without complications: Secondary | ICD-10-CM

## 2021-11-25 LAB — HEMOLYSIS INDEX: Hemolysis Index: 3 Index (ref 0–24)

## 2021-11-25 LAB — CBC AND DIFFERENTIAL
Absolute NRBC: 0 10*3/uL (ref 0.00–0.00)
Basophils Absolute Automated: 0.05 10*3/uL (ref 0.00–0.08)
Basophils Automated: 0.8 %
Eosinophils Absolute Automated: 0.39 10*3/uL (ref 0.00–0.44)
Eosinophils Automated: 6.2 %
Hematocrit: 37.6 % (ref 34.7–43.7)
Hgb: 12.4 g/dL (ref 11.4–14.8)
Immature Granulocytes Absolute: 0.03 10*3/uL (ref 0.00–0.07)
Immature Granulocytes: 0.5 %
Instrument Absolute Neutrophil Count: 3.54 10*3/uL (ref 1.10–6.33)
Lymphocytes Absolute Automated: 1.74 10*3/uL (ref 0.42–3.22)
Lymphocytes Automated: 27.8 %
MCH: 29.2 pg (ref 25.1–33.5)
MCHC: 33 g/dL (ref 31.5–35.8)
MCV: 88.5 fL (ref 78.0–96.0)
MPV: 10.6 fL (ref 8.9–12.5)
Monocytes Absolute Automated: 0.5 10*3/uL (ref 0.21–0.85)
Monocytes: 8 %
Neutrophils Absolute: 3.54 10*3/uL (ref 1.10–6.33)
Neutrophils: 56.7 %
Nucleated RBC: 0 /100 WBC (ref 0.0–0.0)
Platelets: 219 10*3/uL (ref 142–346)
RBC: 4.25 10*6/uL (ref 3.90–5.10)
RDW: 14 % (ref 11–15)
WBC: 6.25 10*3/uL (ref 3.10–9.50)

## 2021-11-25 LAB — COMPREHENSIVE METABOLIC PANEL
ALT: 11 U/L (ref 0–55)
AST (SGOT): 16 U/L (ref 5–41)
Albumin/Globulin Ratio: 1.3 (ref 0.9–2.2)
Albumin: 3.4 g/dL — ABNORMAL LOW (ref 3.5–5.0)
Alkaline Phosphatase: 61 U/L (ref 37–117)
Anion Gap: 8 (ref 5.0–15.0)
BUN: 10 mg/dL (ref 7.0–21.0)
Bilirubin, Total: 0.5 mg/dL (ref 0.2–1.2)
CO2: 27 mEq/L (ref 17–29)
Calcium: 8.8 mg/dL (ref 7.9–10.2)
Chloride: 103 mEq/L (ref 99–111)
Creatinine: 0.8 mg/dL (ref 0.4–1.0)
Globulin: 2.7 g/dL (ref 2.0–3.6)
Glucose: 214 mg/dL — ABNORMAL HIGH (ref 70–100)
Potassium: 3.5 mEq/L (ref 3.5–5.3)
Protein, Total: 6.1 g/dL (ref 6.0–8.3)
Sodium: 138 mEq/L (ref 135–145)

## 2021-11-25 LAB — URINALYSIS, REFLEX TO MICROSCOPIC EXAM IF INDICATED
Bilirubin, UA: NEGATIVE
Blood, UA: NEGATIVE
Ketones UA: NEGATIVE
Nitrite, UA: NEGATIVE
Protein, UR: NEGATIVE
Specific Gravity UA: 1.003 (ref 1.001–1.035)
Urine pH: 7 (ref 5.0–8.0)
Urobilinogen, UA: NORMAL mg/dL

## 2021-11-25 LAB — GFR: EGFR: >60

## 2021-12-15 ENCOUNTER — Encounter (INDEPENDENT_AMBULATORY_CARE_PROVIDER_SITE_OTHER): Payer: Medicare Other | Admitting: Nurse Practitioner

## 2021-12-16 ENCOUNTER — Encounter (FREE_STANDING_LABORATORY_FACILITY): Payer: Medicare Other

## 2021-12-16 DIAGNOSIS — Z7901 Long term (current) use of anticoagulants: Secondary | ICD-10-CM

## 2021-12-16 LAB — PT/INR
PT INR: 2.1 — ABNORMAL HIGH (ref 0.9–1.1)
PT: 24.2 s — ABNORMAL HIGH (ref 10.1–12.9)

## 2022-01-13 ENCOUNTER — Encounter (FREE_STANDING_LABORATORY_FACILITY): Payer: Medicare Other

## 2022-01-13 DIAGNOSIS — Z7901 Long term (current) use of anticoagulants: Secondary | ICD-10-CM

## 2022-01-13 LAB — PT/INR
PT INR: 2.2 — ABNORMAL HIGH (ref 0.9–1.1)
PT: 26 s — ABNORMAL HIGH (ref 10.1–12.9)

## 2022-01-26 ENCOUNTER — Encounter (FREE_STANDING_LABORATORY_FACILITY): Payer: Medicare Other

## 2022-01-26 DIAGNOSIS — E119 Type 2 diabetes mellitus without complications: Secondary | ICD-10-CM

## 2022-01-26 DIAGNOSIS — N390 Urinary tract infection, site not specified: Secondary | ICD-10-CM

## 2022-01-27 ENCOUNTER — Encounter (FREE_STANDING_LABORATORY_FACILITY): Payer: Medicare Other

## 2022-01-27 DIAGNOSIS — E119 Type 2 diabetes mellitus without complications: Secondary | ICD-10-CM

## 2022-01-27 LAB — URINALYSIS, REFLEX TO MICROSCOPIC EXAM IF INDICATED
Bilirubin, UA: NEGATIVE
Glucose, UA: NEGATIVE
Ketones UA: NEGATIVE
Leukocyte Esterase, UA: NEGATIVE
Nitrite, UA: NEGATIVE
Specific Gravity UA: 1.013 (ref 1.001–1.035)
Urine pH: 7 (ref 5.0–8.0)
Urobilinogen, UA: NORMAL mg/dL

## 2022-01-27 LAB — COMPREHENSIVE METABOLIC PANEL
ALT: 11 U/L (ref 0–55)
AST (SGOT): 15 U/L (ref 5–41)
Albumin/Globulin Ratio: 1.4 (ref 0.9–2.2)
Albumin: 3.4 g/dL — ABNORMAL LOW (ref 3.5–5.0)
Alkaline Phosphatase: 52 U/L (ref 37–117)
Anion Gap: 12 (ref 5.0–15.0)
BUN: 16 mg/dL (ref 7.0–21.0)
Bilirubin, Total: 0.5 mg/dL (ref 0.2–1.2)
CO2: 28 mEq/L (ref 17–29)
Calcium: 9.1 mg/dL (ref 7.9–10.2)
Chloride: 103 mEq/L (ref 99–111)
Creatinine: 0.8 mg/dL (ref 0.4–1.0)
Globulin: 2.4 g/dL (ref 2.0–3.6)
Glucose: 121 mg/dL — ABNORMAL HIGH (ref 70–100)
Potassium: 3.3 mEq/L — ABNORMAL LOW (ref 3.5–5.3)
Protein, Total: 5.8 g/dL — ABNORMAL LOW (ref 6.0–8.3)
Sodium: 143 mEq/L (ref 135–145)
eGFR: >60 mL/min/{1.73_m2} (ref >=60)

## 2022-01-27 LAB — CBC AND DIFFERENTIAL
Absolute NRBC: 0 10*3/uL (ref 0.00–0.00)
Basophils Absolute Automated: 0.07 10*3/uL (ref 0.00–0.08)
Basophils Automated: 0.9 %
Eosinophils Absolute Automated: 0.36 10*3/uL (ref 0.00–0.44)
Eosinophils Automated: 4.7 %
Hematocrit: 35.5 % (ref 34.7–43.7)
Hgb: 11.7 g/dL (ref 11.4–14.8)
Immature Granulocytes Absolute: 0.03 10*3/uL (ref 0.00–0.07)
Immature Granulocytes: 0.4 %
Instrument Absolute Neutrophil Count: 4.12 10*3/uL (ref 1.10–6.33)
Lymphocytes Absolute Automated: 2.51 10*3/uL (ref 0.42–3.22)
Lymphocytes Automated: 32.8 %
MCH: 29.8 pg (ref 25.1–33.5)
MCHC: 33 g/dL (ref 31.5–35.8)
MCV: 90.6 fL (ref 78.0–96.0)
MPV: 10.7 fL (ref 8.9–12.5)
Monocytes Absolute Automated: 0.56 10*3/uL (ref 0.21–0.85)
Monocytes: 7.3 %
Neutrophils Absolute: 4.12 10*3/uL (ref 1.10–6.33)
Neutrophils: 53.9 %
Nucleated RBC: 0 /100 WBC (ref 0.0–0.0)
Platelets: 253 10*3/uL (ref 142–346)
RBC: 3.92 10*6/uL (ref 3.90–5.10)
RDW: 14 % (ref 11–15)
WBC: 7.65 10*3/uL (ref 3.10–9.50)

## 2022-01-27 LAB — MICROALBUMIN, RANDOM URINE
Urine Creatinine, Random: 89.2 mg/dL
Urine Microalbumin, Random: 140 ug/ml — ABNORMAL HIGH (ref 0.0–30.0)
Urine Microalbumin/Creatinine Ratio: 157 ug/mg — ABNORMAL HIGH (ref 0–30)

## 2022-01-27 LAB — HEMOLYSIS INDEX: Hemolysis Index: 8 Index (ref 0–24)

## 2022-01-27 LAB — HEMOGLOBIN A1C
Average Estimated Glucose: 162.8 mg/dL
Hemoglobin A1C: 7.3 % — ABNORMAL HIGH (ref 4.6–5.6)

## 2022-02-10 ENCOUNTER — Encounter (FREE_STANDING_LABORATORY_FACILITY): Payer: Medicare Other

## 2022-02-10 DIAGNOSIS — Z7901 Long term (current) use of anticoagulants: Secondary | ICD-10-CM

## 2022-02-10 LAB — PT/INR
PT INR: 1.9 — ABNORMAL HIGH (ref 0.9–1.1)
PT: 22.1 s — ABNORMAL HIGH (ref 10.1–12.9)

## 2022-03-10 ENCOUNTER — Encounter (FREE_STANDING_LABORATORY_FACILITY): Payer: Medicare Other

## 2022-03-10 DIAGNOSIS — I4891 Unspecified atrial fibrillation: Secondary | ICD-10-CM

## 2022-03-10 LAB — PT/INR
PT INR: 2 — ABNORMAL HIGH (ref 0.9–1.1)
PT: 23.7 s — ABNORMAL HIGH (ref 10.1–12.9)

## 2022-03-11 ENCOUNTER — Encounter (HOSPITAL_COMMUNITY): Payer: Medicare Other

## 2022-03-12 DIAGNOSIS — I472 Ventricular tachycardia, unspecified: Secondary | ICD-10-CM

## 2022-03-12 DIAGNOSIS — I480 Paroxysmal atrial fibrillation: Secondary | ICD-10-CM

## 2022-03-15 ENCOUNTER — Encounter (INDEPENDENT_AMBULATORY_CARE_PROVIDER_SITE_OTHER): Payer: Self-pay | Admitting: Cardiovascular Disease

## 2022-03-16 ENCOUNTER — Encounter (INDEPENDENT_AMBULATORY_CARE_PROVIDER_SITE_OTHER): Payer: Self-pay | Admitting: Cardiovascular Disease

## 2022-03-16 ENCOUNTER — Other Ambulatory Visit (INDEPENDENT_AMBULATORY_CARE_PROVIDER_SITE_OTHER): Payer: Self-pay

## 2022-03-16 DIAGNOSIS — Z09 Encounter for follow-up examination after completed treatment for conditions other than malignant neoplasm: Secondary | ICD-10-CM

## 2022-03-17 ENCOUNTER — Encounter (FREE_STANDING_LABORATORY_FACILITY): Payer: Medicare Other

## 2022-03-17 DIAGNOSIS — Z7901 Long term (current) use of anticoagulants: Secondary | ICD-10-CM

## 2022-03-17 LAB — PT/INR
PT INR: 1.9 — ABNORMAL HIGH (ref 0.9–1.1)
PT: 21.9 s — ABNORMAL HIGH (ref 10.1–12.9)

## 2022-03-20 ENCOUNTER — Other Ambulatory Visit (INDEPENDENT_AMBULATORY_CARE_PROVIDER_SITE_OTHER): Payer: Self-pay | Admitting: Internal Medicine

## 2022-03-20 DIAGNOSIS — I4891 Unspecified atrial fibrillation: Secondary | ICD-10-CM

## 2022-03-23 ENCOUNTER — Encounter (INDEPENDENT_AMBULATORY_CARE_PROVIDER_SITE_OTHER): Payer: Self-pay

## 2022-03-24 ENCOUNTER — Encounter (FREE_STANDING_LABORATORY_FACILITY): Payer: Medicare Other

## 2022-03-24 DIAGNOSIS — Z7901 Long term (current) use of anticoagulants: Secondary | ICD-10-CM

## 2022-03-24 LAB — PT/INR
PT INR: 3.8 — ABNORMAL HIGH (ref 0.9–1.1)
PT: 45 s — ABNORMAL HIGH (ref 10.1–12.9)

## 2022-03-27 ENCOUNTER — Other Ambulatory Visit: Payer: Medicare Other

## 2022-03-31 ENCOUNTER — Encounter (FREE_STANDING_LABORATORY_FACILITY): Payer: Medicare Other

## 2022-03-31 DIAGNOSIS — Z7901 Long term (current) use of anticoagulants: Secondary | ICD-10-CM

## 2022-03-31 LAB — PT/INR
PT INR: 4.5 — ABNORMAL HIGH (ref 0.9–1.1)
PT: 53.4 s — ABNORMAL HIGH (ref 10.1–12.9)

## 2022-04-07 ENCOUNTER — Encounter (FREE_STANDING_LABORATORY_FACILITY): Payer: Medicare Other

## 2022-04-07 DIAGNOSIS — I4891 Unspecified atrial fibrillation: Secondary | ICD-10-CM

## 2022-04-07 LAB — PT/INR
PT INR: 3.1 — ABNORMAL HIGH (ref 0.9–1.1)
PT: 36.8 s — ABNORMAL HIGH (ref 10.1–12.9)

## 2022-04-14 ENCOUNTER — Encounter (FREE_STANDING_LABORATORY_FACILITY): Payer: Medicare Other

## 2022-04-14 DIAGNOSIS — Z7901 Long term (current) use of anticoagulants: Secondary | ICD-10-CM

## 2022-04-14 LAB — PT/INR
PT INR: 2.6 — ABNORMAL HIGH (ref 0.9–1.1)
PT: 30.7 s — ABNORMAL HIGH (ref 10.1–12.9)

## 2022-04-17 ENCOUNTER — Ambulatory Visit (INDEPENDENT_AMBULATORY_CARE_PROVIDER_SITE_OTHER): Payer: Medicare Other

## 2022-04-17 DIAGNOSIS — I4891 Unspecified atrial fibrillation: Secondary | ICD-10-CM

## 2022-04-18 ENCOUNTER — Encounter (INDEPENDENT_AMBULATORY_CARE_PROVIDER_SITE_OTHER): Payer: Self-pay | Admitting: Cardiovascular Disease

## 2022-04-21 ENCOUNTER — Encounter (FREE_STANDING_LABORATORY_FACILITY): Payer: Medicare Other

## 2022-04-21 DIAGNOSIS — Z7901 Long term (current) use of anticoagulants: Secondary | ICD-10-CM

## 2022-04-21 LAB — PT/INR
PT INR: 3.1 — ABNORMAL HIGH (ref 0.9–1.1)
PT: 36.2 s — ABNORMAL HIGH (ref 10.1–12.9)

## 2022-04-27 ENCOUNTER — Encounter (FREE_STANDING_LABORATORY_FACILITY): Payer: Medicare Other

## 2022-04-27 DIAGNOSIS — R319 Hematuria, unspecified: Secondary | ICD-10-CM

## 2022-04-27 DIAGNOSIS — R531 Weakness: Secondary | ICD-10-CM

## 2022-04-28 ENCOUNTER — Encounter (FREE_STANDING_LABORATORY_FACILITY): Payer: Medicare Other

## 2022-04-28 DIAGNOSIS — R319 Hematuria, unspecified: Secondary | ICD-10-CM

## 2022-04-28 DIAGNOSIS — R531 Weakness: Secondary | ICD-10-CM

## 2022-04-28 LAB — URINALYSIS, REFLEX TO MICROSCOPIC EXAM IF INDICATED
Bilirubin, UA: NEGATIVE
Glucose, UA: NEGATIVE
Ketones UA: NEGATIVE
Nitrite, UA: NEGATIVE
Specific Gravity UA: 1.009 (ref 1.001–1.035)
Urine pH: 7 (ref 5.0–8.0)
Urobilinogen, UA: NORMAL mg/dL

## 2022-04-28 LAB — HEMOGLOBIN AND HEMATOCRIT, BLOOD
Hematocrit: 34.9 % (ref 34.7–43.7)
Hgb: 11.5 g/dL (ref 11.4–14.8)

## 2022-05-02 ENCOUNTER — Emergency Department: Payer: Medicare Other

## 2022-05-02 ENCOUNTER — Emergency Department
Admission: EM | Admit: 2022-05-02 | Discharge: 2022-05-02 | Disposition: A | Discharge status: Home / Self Care | Payer: Medicare Other | Source: Ambulatory Visit | Attending: Emergency Medicine | Admitting: Emergency Medicine

## 2022-05-02 DIAGNOSIS — N390 Urinary tract infection, site not specified: Secondary | ICD-10-CM | POA: Insufficient documentation

## 2022-05-02 DIAGNOSIS — B9620 Unspecified Escherichia coli [E. coli] as the cause of diseases classified elsewhere: Secondary | ICD-10-CM | POA: Insufficient documentation

## 2022-05-02 DIAGNOSIS — Z7901 Long term (current) use of anticoagulants: Secondary | ICD-10-CM | POA: Insufficient documentation

## 2022-05-02 LAB — URINALYSIS REFLEX TO MICROSCOPIC EXAM - REFLEX TO CULTURE
Bilirubin, UA: NEGATIVE
Blood, UA: NEGATIVE
Glucose, UA: NEGATIVE
Ketones UA: NEGATIVE
Leukocyte Esterase, UA: NEGATIVE
Nitrite, UA: NEGATIVE
Protein, UR: NEGATIVE
Specific Gravity UA: 1.007 (ref 1.001–1.035)
Urine pH: 8 (ref 5.0–8.0)
Urobilinogen, UA: NORMAL mg/dL

## 2022-05-02 LAB — PT AND APTT
PT INR: 2.5 — ABNORMAL HIGH (ref 0.9–1.1)
PT: 28.8 s — ABNORMAL HIGH (ref 10.1–12.9)
PTT: 26 s — ABNORMAL LOW (ref 27–39)

## 2022-05-02 LAB — COMPREHENSIVE METABOLIC PANEL
ALT: 20 U/L (ref 0–55)
AST (SGOT): 30 U/L (ref 5–41)
Albumin/Globulin Ratio: 1.3 (ref 0.9–2.2)
Albumin: 4 g/dL (ref 3.5–5.0)
Alkaline Phosphatase: 58 U/L (ref 37–117)
Anion Gap: 13 (ref 5.0–15.0)
BUN: 15 mg/dL (ref 7.0–21.0)
Bilirubin, Total: 0.9 mg/dL (ref 0.2–1.2)
CO2: 29 mEq/L (ref 17–29)
Calcium: 10 mg/dL (ref 7.9–10.2)
Chloride: 95 mEq/L — ABNORMAL LOW (ref 99–111)
Creatinine: 0.8 mg/dL (ref 0.4–1.0)
Globulin: 3.1 g/dL (ref 2.0–3.6)
Glucose: 127 mg/dL — ABNORMAL HIGH (ref 70–100)
Potassium: 3.3 mEq/L — ABNORMAL LOW (ref 3.5–5.3)
Protein, Total: 7.1 g/dL (ref 6.0–8.3)
Sodium: 137 mEq/L (ref 135–145)
eGFR: >60 mL/min/{1.73_m2} (ref >=60)

## 2022-05-02 LAB — CBC AND DIFFERENTIAL
Absolute NRBC: 0 10*3/uL (ref 0.00–0.00)
Basophils Absolute Automated: 0.08 10*3/uL (ref 0.00–0.08)
Basophils Automated: 0.8 %
Eosinophils Absolute Automated: 0.25 10*3/uL (ref 0.00–0.44)
Eosinophils Automated: 2.6 %
Hematocrit: 39.4 % (ref 34.7–43.7)
Hgb: 13.1 g/dL (ref 11.4–14.8)
Immature Granulocytes Absolute: 0.05 10*3/uL (ref 0.00–0.07)
Immature Granulocytes: 0.5 %
Instrument Absolute Neutrophil Count: 5.65 10*3/uL (ref 1.10–6.33)
Lymphocytes Absolute Automated: 2.77 10*3/uL (ref 0.42–3.22)
Lymphocytes Automated: 29 %
MCH: 29.3 pg (ref 25.1–33.5)
MCHC: 33.2 g/dL (ref 31.5–35.8)
MCV: 88.1 fL (ref 78.0–96.0)
MPV: 10.3 fL (ref 8.9–12.5)
Monocytes Absolute Automated: 0.74 10*3/uL (ref 0.21–0.85)
Monocytes: 7.8 %
Neutrophils Absolute: 5.65 10*3/uL (ref 1.10–6.33)
Neutrophils: 59.3 %
Nucleated RBC: 0 /100 WBC (ref 0.0–0.0)
Platelets: 264 10*3/uL (ref 142–346)
RBC: 4.47 10*6/uL (ref 3.90–5.10)
RDW: 15 % (ref 11–15)
WBC: 9.54 10*3/uL — ABNORMAL HIGH (ref 3.10–9.50)

## 2022-05-02 LAB — LACTIC ACID, PLASMA: Lactic Acid: 1.2 mmol/L (ref 0.2–2.0)

## 2022-05-02 MED ORDER — STERILE WATER FOR INJECTION IJ/IV SOLN (WRAP)
500.0000 mg | Freq: Four times a day (QID) | INTRAVENOUS | Status: DC
Start: 2022-05-02 — End: 2022-05-02
  Administered 2022-05-02: 500 mg via INTRAVENOUS
  Filled 2022-05-02: qty 0.5

## 2022-05-02 MED ORDER — SULFAMETHOXAZOLE-TRIMETHOPRIM 800-160 MG PO TABS
1.0000 | ORAL_TABLET | Freq: Two times a day (BID) | ORAL | 0 refills | Status: AC
Start: 2022-05-02 — End: 2022-05-09

## 2022-05-02 MED ORDER — WARFARIN SODIUM 4 MG PO TABS
2.0000 mg | ORAL_TABLET | Freq: Every day | ORAL | 0 refills | Status: DC
Start: 2022-05-02 — End: 2022-09-01

## 2022-05-02 MED ORDER — WARFARIN SODIUM 1 MG PO TABS
2.0000 mg | ORAL_TABLET | Freq: Every day | ORAL | 0 refills | Status: DC
Start: 2022-05-02 — End: 2022-05-02

## 2022-05-02 MED ORDER — SODIUM CHLORIDE 0.9 % IV BOLUS
1000.0000 mL | Freq: Once | INTRAVENOUS | Status: AC
Start: 2022-05-02 — End: 2022-05-02
  Administered 2022-05-02: 1000 mL via INTRAVENOUS

## 2022-05-02 NOTE — Discharge Instructions (Signed)
Coumadin must be monitored closely.  Hold coumadin today.  Cut coumadin dosage in half  Repeat INR in 2 days

## 2022-05-02 NOTE — ED Triage Notes (Signed)
Client says she fell and hit her head a few days ago and currently having headache. Looks confused and hesitant in speech. Drifts away from topic of discussion.  As per EMS,  Pt has a known UTI with culture that came back positive for E. coli. Pt finished a course of Macrobid. Pt is lethargic and slow to respond. Pt endorses urinary frequency and some burning. Pt is afebrile. AOx3, disoriented to [place]. Calm and cooperative.

## 2022-05-02 NOTE — ED Provider Notes (Signed)
[facility] EMERGENCY DEPARTMENT H&P         CLINICAL INFORMATION        HPI:      Chief Complaint: Urinary Tract Infection Symptoms  .    Margaret Shea is a 88 y.o. female who presents with moderate intermittent dysuria x5 days.  [facility].  Started Macrobid as outpatient completed course but still symptomatic and lethargic dehydrated so sent in from SNF.  No fevers  History of MDR E. Coli  Urine culture from 04/27/2022 with greater than 100,000 E. coli multidrug-resistant except for amikacin ertapenem meropenem Macrobid and Bactrim  Patient also with 2 ground-level falls past several days.  Takes Coumadin.  No prior images    History obtained from: patient, family, review of prior chart          ROS:      Positive and negative ROS elements as per HPI.  All other systems reviewed and negative.      Physical Exam:      Pulse 61  BP 150/68  Resp 20  SpO2 98 %  Temp 97.8 F (36.6 C)    Physical Exam  Vitals and nursing note reviewed.   Constitutional:       Appearance: Normal appearance. She is well-developed.   HENT:      Head: Normocephalic and atraumatic.   Eyes:      Extraocular Movements: Extraocular movements intact.      Conjunctiva/sclera: Conjunctivae normal.      Pupils: Pupils are equal, round, and reactive to light.   Cardiovascular:      Rate and Rhythm: Normal rate and regular rhythm.      Heart sounds: Normal heart sounds.   Pulmonary:      Effort: Pulmonary effort is normal.      Breath sounds: Normal breath sounds.   Abdominal:      General: Bowel sounds are normal.      Palpations: Abdomen is soft.      Tenderness: There is no abdominal tenderness.   Musculoskeletal:         General: Normal range of motion.      Cervical back: Normal range of motion and neck supple.      Right lower leg: No edema.      Left lower leg: No edema.   Skin:     General: Skin is warm and dry.   Neurological:      General: No focal deficit  present.      Mental Status: She is alert. Mental status is at baseline.      Deep Tendon Reflexes: Reflexes are normal and symmetric.   Psychiatric:         Mood and Affect: Mood normal.         Thought Content: Thought content normal.                  PAST HISTORY        Primary Care Provider: Talbert Nan, MD        PMH/PSH:    .     Past Medical History:   Diagnosis Date    Arrhythmia     Bradycardia    Atrial fibrillation     Atrial tachycardia     Chest pain     Constipation     Gastroesophageal reflux disease     on med    Hyperlipidemia     Hypothyroidism  Left arm pain     Pacemaker     PNA (pneumonia) 2016    hospitalized over night for pneumonia    Prolapse of uterus     SOB (shortness of breath)     Tinnitus     Urinary tract infection 12/2015    on med, no symptoms presently    Ventricular tachycardia     Vertigo        She has a past surgical history that includes Back surgery (1980); Mohs surgery; Insert / replace / remove pacemaker (October 2011); Dental surgery; Cardiac catheterization (08/16/2015); Hysterectomy (2004); Adenoidectomy; Tonsillectomy; COLPECTOMY (N/A, 02/07/2016); CYSTOSCOPY (N/A, 02/07/2016); ECHOCARDIOGRAM, TRANSTHORACIC (05/07/2019); and PET MPI Study (01/28/2019).      Social/Family History:      She reports that she has never smoked. She has never used smokeless tobacco. She reports current alcohol use of about 1.0 standard drink of alcohol per week. She reports that she does not use drugs.    Family History   Problem Relation Age of Onset    Myocardial Infarction Father     Coronary artery disease Father     Hyperlipidemia Father     Hyperlipidemia Sister     Hypertension Neg Hx          Listed Medications on Arrival:    .     Home Medications               calcium carbonate (OS-CAL) 600 MG Tab tablet     Take 600 mg by mouth 2 (two) times daily with meals     Cholecalciferol (VITAMIN D PO)     Take 1 tablet by mouth daily     Cyanocobalamin (VITAMIN B 12) 250 MCG Lozenge      Take 1 tablet by mouth daily     escitalopram (LEXAPRO) 10 MG tablet     Take 10 mg by mouth daily     esomeprazole (NEXIUM) 20 MG capsule     Take 20 mg by mouth every morning before breakfast.     estradiol (ESTRACE) 0.1 MG/GM vaginal cream     Place 1 g vaginally twice a week     famotidine (PEPCID) 20 MG tablet     TAKE ONE TABLET BY MOUTH TWICE A DAY     fludrocortisone (FLORINEF) 0.1 MG tablet     Take 0.1 mg by mouth daily       levothyroxine (SYNTHROID, LEVOTHROID) 50 MCG tablet     Take 50 mcg by mouth every morning.         loratadine (CLARITIN) 10 MG tablet     Take 10 mg by mouth daily     metFORMIN (FORTAMET) 500 MG (OSM) 24 hr tablet     Take 500 mg by mouth every morning with breakfast 1/2 tab po qd     metoprolol tartrate (LOPRESSOR) 25 MG tablet     Take 1 tablet (25 mg) by mouth as needed (For afib)     nitroglycerin (NITROSTAT) 0.4 MG SL tablet     Place 1 tablet (0.4 mg total) under the tongue as needed for Chest pain (q51min x3. call MD or 911 if pain persists).     rosuvastatin (CRESTOR) 20 MG tablet     Take 20 mg by mouth daily     senna-docusate (PERICOLACE) 8.6-50 MG per tablet     Take 2 tablets by mouth daily.     Patient taking differently: Take 1 tablet by mouth  every other day     sodium bicarbonate 650 MG tablet     Take 650 mg by mouth 2 (two) times daily     warfarin (COUMADIN) 4 MG tablet     Take 0.5 tablets (2 mg) by mouth daily                       Allergies: She is allergic to flecainide and propafenone.            VISIT INFORMATION        Clinical Course in the ED:             Medications Given in the ED:    .     ED Medication Orders (From admission, onward)      Start Ordered     Status Ordering Provider    05/02/22 1715 05/02/22 1714    Every 6 hours        Route: Intravenous  Ordered Dose: 500 mg       Discontinued Samuel BoucheMAHANEY, Arlyn Bumpus J    05/02/22 1714 05/02/22 1713  sodium chloride 0.9 % bolus 1,000 mL  Once        Route: Intravenous  Ordered Dose: 1,000 mL       Last MAR  action: Stopped Orlene OchMAHANEY, Hayven Croy J              Procedures:      Procedures      Interpretations:                      RESULTS        Lab Results:      Results       Procedure Component Value Units Date/Time    Culture Blood Aerobic and Anaerobic [[id number]][[id number]] Collected: 05/02/22 1826    Specimen: Arm from Blood, Venipuncture Updated: 05/02/22 2114    Narrative:      1 BLUE+1 PURPLE    Culture Blood Aerobic and Anaerobic [[id number]][[id number]] Collected: 05/02/22 1826    Specimen: Arm from Blood, Venipuncture Updated: 05/02/22 2114    Narrative:      1 BLUE+1 PURPLE    Urinalysis Reflex to Microscopic Exam- Reflex to Culture [[id number]][[id number]] Collected: 05/02/22 2004     Updated: 05/02/22 2036     Urine Type Urine, Clean Ca     Color, UA Colorless     Clarity, UA Clear     Specific Gravity UA 1.007     Urine pH 8.0     Leukocyte Esterase, UA Negative     Nitrite, UA Negative     Protein, UR Negative     Glucose, UA Negative     Ketones UA Negative     Urobilinogen, UA Normal mg/dL      Bilirubin, UA Negative     Blood, UA Negative    PT/APTT [[id number]][[id number]]  (Abnormal) Collected: 05/02/22 1840     Updated: 05/02/22 1942     PT 28.8 sec      PT INR 2.5     PTT 26 sec     Lactic Acid [[id number]][[id number]] Collected: 05/02/22 1826    Specimen: Blood Updated: 05/02/22 1847     Lactic Acid 1.2 mmol/L     Comprehensive metabolic panel [[id number]][[id number]]  (Abnormal) Collected: 05/02/22 1556    Specimen: Blood Updated: 05/02/22 1659     Glucose 127 mg/dL      BUN 53.615.0 mg/dL      Creatinine 0.8 mg/dL  Sodium 137 mEq/L      Potassium 3.3 mEq/L      Chloride 95 mEq/L      CO2 29 mEq/L      Calcium 10.0 mg/dL      Protein, Total 7.1 g/dL      Albumin 4.0 g/dL      AST (SGOT) 30 U/L      ALT 20 U/L      Alkaline Phosphatase 58 U/L      Bilirubin, Total 0.9 mg/dL      Globulin 3.1 g/dL      Albumin/Globulin Ratio 1.3     Anion Gap 13.0     eGFR >60.0 mL/min/1.73 m2     CBC and differential [[id number]]  (Abnormal) Collected: 05/02/22 1556    Specimen: Blood Updated:  05/02/22 1642     WBC 9.54 x10 3/uL      Hgb 13.1 g/dL      Hematocrit 41.6 %      Platelets 264 x10 3/uL      RBC 4.47 x10 6/uL      MCV 88.1 fL      MCH 29.3 pg      MCHC 33.2 g/dL      RDW 15 %      MPV 10.3 fL      Instrument Absolute Neutrophil Count 5.65 x10 3/uL      Neutrophils 59.3 %      Lymphocytes Automated 29.0 %      Monocytes 7.8 %      Eosinophils Automated 2.6 %      Basophils Automated 0.8 %      Immature Granulocytes 0.5 %      Nucleated RBC 0.0 /100 WBC      Neutrophils Absolute 5.65 x10 3/uL      Lymphocytes Absolute Automated 2.77 x10 3/uL      Monocytes Absolute Automated 0.74 x10 3/uL      Eosinophils Absolute Automated 0.25 x10 3/uL      Basophils Absolute Automated 0.08 x10 3/uL      Immature Granulocytes Absolute 0.05 x10 3/uL      Absolute NRBC 0.00 x10 3/uL                 Radiology Results:      CT Head WO Contrast   Final Result       1.No acute intracranial hemorrhage or mass effect.   2.Chronic appearing small vessel ischemic changes.      Nicoletta Dress, MD   05/02/2022 6:13 PM                 Visit date: 05/02/2022      CLINICAL SUMMARY          Diagnosis:    .     Final diagnoses:   E-coli UTI         MDM Notes:    Medical Decision Making  Amount and/or Complexity of Data Reviewed  Labs: ordered.  Radiology: ordered.    Risk  Prescription drug management.         I am the first provider for this patient.  I reviewed the vital signs, nursing notes, past medical history, past surgical history, family history and social history.  I have reviewed the patient's previous charts.    9 PM printed reviewed labs head CT with patient's son.  Patient and son prefer discharge home.  Previous sensitivities show sensitive to Bactrim so that would be possible.  Explained  Coumadin needs to be monitored closely.         Disposition:      Discharge         Discharge Prescriptions       Medication Sig Dispense Auth. Provider    sulfamethoxazole-trimethoprim (BACTRIM DS) 800-160 MG per tablet Take 1 tablet  by mouth 2 (two) times daily for 7 days 14 tablet Camera Krienke, Sheria Lang, MD    warfarin (COUMADIN) 4 MG tablet Take 0.5 tablets (2 mg) by mouth daily 7 tablet Louvinia Cumbo, Sheria Lang, MD    warfarin (COUMADIN) 1 MG tablet  (Status: Discontinued) Take 2 tablets (2 mg) by mouth daily Take 1 1/2 tab along with 4 mg tab (total 5.5 mg) 7 tablet Sailor Haughn, Sheria Lang, MD                    Scribe Attestation:      I was acting as a Neurosurgeon for Samuel Bouche, MD on [facility] MARIE        is scribing for me on [facility]. This note and the patient instructions accurately reflect work and decisions made by me.  Samuel Bouche, MD                  Samuel Bouche, MD  05/02/22 [id number]

## 2022-05-05 ENCOUNTER — Encounter (FREE_STANDING_LABORATORY_FACILITY): Payer: Medicare Other

## 2022-05-05 DIAGNOSIS — E876 Hypokalemia: Secondary | ICD-10-CM

## 2022-05-05 DIAGNOSIS — I482 Chronic atrial fibrillation, unspecified: Secondary | ICD-10-CM

## 2022-05-05 DIAGNOSIS — E039 Hypothyroidism, unspecified: Secondary | ICD-10-CM

## 2022-05-05 LAB — PT/INR
PT INR: 3.1 — ABNORMAL HIGH (ref 0.9–1.1)
PT: 36.6 s — ABNORMAL HIGH (ref 10.1–12.9)

## 2022-05-14 ENCOUNTER — Encounter (HOSPITAL_BASED_OUTPATIENT_CLINIC_OR_DEPARTMENT_OTHER): Payer: Medicare Other | Admitting: Cardiovascular Disease

## 2022-05-14 DIAGNOSIS — I495 Sick sinus syndrome: Secondary | ICD-10-CM

## 2022-05-14 DIAGNOSIS — I480 Paroxysmal atrial fibrillation: Secondary | ICD-10-CM

## 2022-05-14 DIAGNOSIS — Z950 Presence of cardiac pacemaker: Secondary | ICD-10-CM

## 2022-05-19 ENCOUNTER — Encounter (FREE_STANDING_LABORATORY_FACILITY): Payer: Medicare Other

## 2022-05-19 DIAGNOSIS — I482 Chronic atrial fibrillation, unspecified: Secondary | ICD-10-CM

## 2022-05-19 DIAGNOSIS — I480 Paroxysmal atrial fibrillation: Secondary | ICD-10-CM

## 2022-05-19 DIAGNOSIS — E139 Other specified diabetes mellitus without complications: Secondary | ICD-10-CM

## 2022-05-19 LAB — CBC AND DIFFERENTIAL
Absolute NRBC: 0 10*3/uL (ref 0.00–0.00)
Basophils Absolute Automated: 0.04 10*3/uL (ref 0.00–0.08)
Basophils Automated: 0.5 %
Eosinophils Absolute Automated: 0.22 10*3/uL (ref 0.00–0.44)
Eosinophils Automated: 2.8 %
Hematocrit: 30.7 % — ABNORMAL LOW (ref 34.7–43.7)
Hgb: 10.1 g/dL — ABNORMAL LOW (ref 11.4–14.8)
Immature Granulocytes Absolute: 0.05 10*3/uL (ref 0.00–0.07)
Immature Granulocytes: 0.6 %
Instrument Absolute Neutrophil Count: 5.01 10*3/uL (ref 1.10–6.33)
Lymphocytes Absolute Automated: 1.94 10*3/uL (ref 0.42–3.22)
Lymphocytes Automated: 24.4 %
MCH: 29.6 pg (ref 25.1–33.5)
MCHC: 32.9 g/dL (ref 31.5–35.8)
MCV: 90 fL (ref 78.0–96.0)
MPV: 9.4 fL (ref 8.9–12.5)
Monocytes Absolute Automated: 0.7 10*3/uL (ref 0.21–0.85)
Monocytes: 8.8 %
Neutrophils Absolute: 5.01 10*3/uL (ref 1.10–6.33)
Neutrophils: 62.9 %
Nucleated RBC: 0 /100 WBC (ref 0.0–0.0)
Platelets: 236 10*3/uL (ref 142–346)
RBC: 3.41 10*6/uL — ABNORMAL LOW (ref 3.90–5.10)
RDW: 16 % — ABNORMAL HIGH (ref 11–15)
WBC: 7.96 10*3/uL (ref 3.10–9.50)

## 2022-05-19 LAB — COMPREHENSIVE METABOLIC PANEL
ALT: 38 U/L (ref 0–55)
AST (SGOT): 22 U/L (ref 5–41)
Albumin/Globulin Ratio: 1.2 (ref 0.9–2.2)
Albumin: 3.1 g/dL — ABNORMAL LOW (ref 3.5–5.0)
Alkaline Phosphatase: 63 U/L (ref 37–117)
Anion Gap: 11 (ref 5.0–15.0)
BUN: 10 mg/dL (ref 7.0–21.0)
Bilirubin, Total: 0.6 mg/dL (ref 0.2–1.2)
CO2: 27 mEq/L (ref 17–29)
Calcium: 8.7 mg/dL (ref 7.9–10.2)
Chloride: 101 mEq/L (ref 99–111)
Creatinine: 0.7 mg/dL (ref 0.4–1.0)
Globulin: 2.6 g/dL (ref 2.0–3.6)
Glucose: 112 mg/dL — ABNORMAL HIGH (ref 70–100)
Potassium: 3.5 mEq/L (ref 3.5–5.3)
Protein, Total: 5.7 g/dL — ABNORMAL LOW (ref 6.0–8.3)
Sodium: 139 mEq/L (ref 135–145)
eGFR: >60 mL/min/{1.73_m2} (ref >=60)

## 2022-05-19 LAB — TSH: TSH: 6.22 u[IU]/mL — ABNORMAL HIGH (ref 0.35–4.94)

## 2022-05-19 LAB — HEMOGLOBIN A1C
Average Estimated Glucose: 122.6 mg/dL
Hemoglobin A1C: 5.9 % — ABNORMAL HIGH (ref 4.6–5.6)

## 2022-05-19 LAB — PT/INR
PT INR: 1.4 — ABNORMAL HIGH (ref 0.9–1.1)
PT: 16.2 s — ABNORMAL HIGH (ref 10.1–12.9)

## 2022-05-19 LAB — HEMOLYSIS INDEX: Hemolysis Index: 2 Index (ref 0–24)

## 2022-05-19 LAB — T4, FREE: T4 Free: 1.19 ng/dL (ref 0.69–1.48)

## 2022-05-26 ENCOUNTER — Encounter (FREE_STANDING_LABORATORY_FACILITY): Payer: Medicare Other

## 2022-05-26 DIAGNOSIS — I482 Chronic atrial fibrillation, unspecified: Secondary | ICD-10-CM

## 2022-05-26 DIAGNOSIS — Z7901 Long term (current) use of anticoagulants: Secondary | ICD-10-CM

## 2022-05-26 LAB — PT/INR
PT INR: 2.4 — ABNORMAL HIGH (ref 0.9–1.1)
PT: 28.4 s — ABNORMAL HIGH (ref 10.1–12.9)

## 2022-06-02 ENCOUNTER — Encounter (FREE_STANDING_LABORATORY_FACILITY): Payer: Medicare Other

## 2022-06-02 DIAGNOSIS — Z7901 Long term (current) use of anticoagulants: Secondary | ICD-10-CM

## 2022-06-02 DIAGNOSIS — I482 Chronic atrial fibrillation, unspecified: Secondary | ICD-10-CM

## 2022-06-02 LAB — PT/INR
PT INR: 3.4 — ABNORMAL HIGH (ref 0.9–1.1)
PT: 40.5 s — ABNORMAL HIGH (ref 10.1–12.9)

## 2022-06-06 ENCOUNTER — Inpatient Hospital Stay (INDEPENDENT_AMBULATORY_CARE_PROVIDER_SITE_OTHER): Payer: Medicare Other | Admitting: Nurse Practitioner

## 2022-06-08 ENCOUNTER — Ambulatory Visit (INDEPENDENT_AMBULATORY_CARE_PROVIDER_SITE_OTHER): Payer: Medicare Other | Admitting: Nurse Practitioner

## 2022-06-08 ENCOUNTER — Encounter (INDEPENDENT_AMBULATORY_CARE_PROVIDER_SITE_OTHER): Payer: Self-pay | Admitting: Nurse Practitioner

## 2022-06-08 VITALS — BP 124/72 | HR 64 | Ht 63.0 in | Wt 114.2 lb

## 2022-06-08 DIAGNOSIS — Z950 Presence of cardiac pacemaker: Secondary | ICD-10-CM

## 2022-06-08 DIAGNOSIS — Z09 Encounter for follow-up examination after completed treatment for conditions other than malignant neoplasm: Secondary | ICD-10-CM

## 2022-06-08 DIAGNOSIS — I480 Paroxysmal atrial fibrillation: Secondary | ICD-10-CM

## 2022-06-08 LAB — ECG 12-LEAD
Atrial Rate: 64 {beats}/min
P Axis: 95 degrees
P-R Interval: 222 ms
Q-T Interval: 486 ms
QRS Duration: 92 ms
QTC Calculation (Bezet): 501 ms
R Axis: -34 degrees
T Axis: -51 degrees
Ventricular Rate: 64 {beats}/min

## 2022-06-08 NOTE — Progress Notes (Signed)
[place] [facility] OFF[facility] PROGRESS NOTE    [facility] OFFICE -CARDIOLOGY  [address]  [place] [address]  Dept: [phone]  Dept Fax: [phone]       Patient Name: Margaret Shea    Date of Visit:  June 08, 2022  Date of Birth: [birth year]  AGE: 88 y.o.  Medical Record #: [id number]  Requesting Physician: Talbert Nan, MD      CHIEF COMPLAINT: Atrial Fibrillation (PAF - hosp f/u)      HISTORY OF PRESENT ILLNESS:    She is a pleasant 88 y.o. female who presents today for hospital follow-up with tachycardia.  Patient has a history of paroxysmal atrial fibrillation on anticoagulation with Coumadin, orthostatic hypotension leading to several falls on fludrocortisone, PET perfusion study in June 2020 with normal perfusion and normal EF.  The patient was admitted to [facility] after a fall that resulted in a L2 compression fracture.  Cardiology was called because there was concern over a wide-complex tachycardia that was possibly ventricular tachycardia.  Is actually probably her atrial flutter with ventricular pacing.  Was reviewed with the EP and she was started on a low-dose of metoprolol.  She has not had any further falls.  She is here today in a wheelchair with her son.  She does get up with a walker at her assisted facility but has not been walking much.  She notes herself to be chronically fatigued.  Denies chest pain or shortness of breath.      PAST MEDICAL HISTORY: She has a past medical history of Arrhythmia, Atrial fibrillation, Atrial tachycardia, Chest pain, Constipation, Gastroesophageal reflux disease, Hyperlipidemia, Hypothyroidism, Left arm pain, Pacemaker, PNA (pneumonia) (2016), Prolapse of uterus, SOB (shortness of breath), Tinnitus, Urinary tract infection (12/2015), Ventricular tachycardia, and Vertigo. She has a past surgical history that includes Back surgery (1980); Mohs surgery; Insert / replace / remove  pacemaker (October 2011); Dental surgery; Cardiac catheterization (08/16/2015); Hysterectomy (2004); Adenoidectomy; Tonsillectomy; COLPECTOMY (N/A, 02/07/2016); CYSTOSCOPY (N/A, 02/07/2016); ECHOCARDIOGRAM, TRANSTHORACIC (05/07/2019); and PET MPI Study (01/28/2019).    ALLERGIES:   Allergies   Allergen Reactions    Flecainide      Tinnitus,     Propafenone      dizziness       MEDICATIONS:   Current Outpatient Medications:     calcium carbonate (OS-CAL) 600 MG Tab tablet, Take 1 tablet (600 mg) by mouth 2 (two) times daily with meals, Disp: , Rfl:     Cholecalciferol (VITAMIN D PO), Take 1 tablet by mouth daily, Disp: , Rfl:     Cyanocobalamin (VITAMIN B 12) 250 MCG Lozenge, Take 1 tablet by mouth daily, Disp: , Rfl:     escitalopram (LEXAPRO) 10 MG tablet, Take 1 tablet (10 mg) by mouth daily, Disp: , Rfl:     esomeprazole (NEXIUM) 20 MG capsule, Take 1 capsule (20 mg) by mouth every morning before breakfast, Disp: , Rfl:     estradiol (ESTRACE) 0.1 MG/GM vaginal cream, Place 1 g vaginally twice a week, Disp: , Rfl:     famotidine (PEPCID) 20 MG tablet, TAKE ONE TABLET BY MOUTH TWICE A DAY (Patient taking differently: 2 (two) times daily as needed), Disp: 180 tablet, Rfl: 3    fludrocortisone (FLORINEF) 0.1 MG tablet, Take 1 tablet (0.1 mg) by mouth daily, Disp: , Rfl:     levothyroxine (SYNTHROID, LEVOTHROID) 50 MCG tablet, Take 1 tablet (50 mcg) by mouth every morning, Disp: ,  Rfl:     loratadine (CLARITIN) 10 MG tablet, Take 1 tablet (10 mg) by mouth daily, Disp: , Rfl:     metFORMIN (FORTAMET) 500 MG (OSM) 24 hr tablet, Take 1 tablet (500 mg) by mouth every morning with breakfast 1/2 tab po qd, Disp: , Rfl:     metoprolol tartrate (LOPRESSOR) 25 MG tablet, Take 1 tablet (25 mg) by mouth as needed (For afib), Disp: 90 tablet, Rfl: 3    nitroglycerin (NITROSTAT) 0.4 MG SL tablet, Place 1 tablet (0.4 mg total) under the tongue as needed for Chest pain (q96min x3. call MD or 911 if pain persists)., Disp: 25 tablet,  Rfl: 3    rosuvastatin (CRESTOR) 20 MG tablet, Take 1 tablet (20 mg) by mouth daily, Disp: , Rfl:     senna-docusate (PERICOLACE) 8.6-50 MG per tablet, Take 2 tablets by mouth daily. (Patient taking differently: Take 1 tablet by mouth as needed for Constipation), Disp: 30 tablet, Rfl: 0    sodium bicarbonate 650 MG tablet, Take 1 tablet (650 mg) by mouth 2 (two) times daily, Disp: , Rfl:     warfarin (COUMADIN) 4 MG tablet, Take 0.5 tablets (2 mg) by mouth daily, Disp: 7 tablet, Rfl: 0     FAMILY HISTORY: family history includes Coronary artery disease in her father; Hyperlipidemia in her father and sister; Myocardial Infarction in her father.    SOCIAL HISTORY: She reports that she has never smoked. She has never used smokeless tobacco. She reports current alcohol use of about 1.0 standard drink of alcohol per week. She reports that she does not use drugs.    PHYSICAL EXAMINATION    Visit Vitals  BP 124/72 (BP Site: Right arm, Patient Position: Sitting, Cuff Size: Medium)   Pulse 64   Ht 1.6 m (5\' 3" )   Wt 51.8 kg (114 lb 3.2 oz)   LMP  (LMP Unknown)   BMI 20.23 kg/m       General Appearance:  A well-appearing female in no acute distress.    Skin: Warm and dry to touch,  Head: Normocephalic, normal hair pattern  Eyes: conjunctivae and lids unremarkable.  ENT:  No pallor or cyanosis.  Dentition good.   Neck: JVP normal,   Chest: Clear to auscultation bilaterally with good air movement and respiratory effort and no wheezes, rales, or rhonchi   Cardiovascular: Regular rhythm, S1 normal, S2 normal, No S3 or S4, Apical impulse not displaced. No murmurs. No gallops or rubs detected   Abdomen: Soft, nontender,  Extremities: Warm without edema. No clubbing, or cyanosis. All peripheral pulses are full and equal.   Neuro: Alert and oriented x3. No gross motor or sensory deficits noted, affect appropriate.        ECG: A paced    LABS:   No results found for: "CBC"  Lab Results   Component Value Date    AST 22 05/19/2022     ALT 38 05/19/2022     No results found for: "LIPID"  Lab Results   Component Value Date    HGBA1C 5.9 (H) 05/19/2022    TSH 6.22 (H) 05/19/2022          Most recent echo and nuclear study reviewed.      IMPRESSION:   Ms. Masullo is a 88 y.o. female with the following problems:    Wide-complex tachycardia at the hospital that was felt to likely be an atrial arrhythmia with V pacing  History of atrial arrhythmias  Orthostatic hypotension  Multiple falls  Status post [facility] permanent pacemaker  Recent fall with L2 compression fracture        RECOMMENDATIONS:    The patient is going to continue on her current medications including the metoprolol.  Her blood pressure was in the 120s in the office today.  I will get her scheduled with the EP for them to interrogate her pacemaker and see if she is having episodes of rapid heart rates.  We will plan for her to come back and see Dr. Luster Landsberg in 6 months.  We may need to reevaluate her anticoagulation given her multiple falls.                                                     Orders Placed This Encounter   Procedures    ECG 12 lead (Normal)    EP APP Visit ([facility] [place])    Office Visit ([facility] [place])       No orders of the defined types were placed in this encounter.      SIGNED:    Griffin Basil, NP          This note was generated by the Dragon speech recognition and may contain errors or omissions not intended by the user. Grammatical errors, random word insertions, deletions, pronoun errors, and incomplete sentences are occasional consequences of this technology due to software limitations. Not all errors are caught or corrected. If there are questions or concerns about the content of this note or information contained within the body of this dictation, they should be addressed directly with the author for clarification.

## 2022-06-09 ENCOUNTER — Encounter (FREE_STANDING_LABORATORY_FACILITY): Payer: Medicare Other

## 2022-06-09 DIAGNOSIS — I482 Chronic atrial fibrillation, unspecified: Secondary | ICD-10-CM

## 2022-06-09 DIAGNOSIS — Z7901 Long term (current) use of anticoagulants: Secondary | ICD-10-CM

## 2022-06-09 LAB — PT/INR
PT INR: 3.4 — ABNORMAL HIGH (ref 0.9–1.1)
PT: 39.5 s — ABNORMAL HIGH (ref 10.1–12.9)

## 2022-06-16 ENCOUNTER — Encounter (FREE_STANDING_LABORATORY_FACILITY): Payer: Medicare Other

## 2022-06-16 DIAGNOSIS — E139 Other specified diabetes mellitus without complications: Secondary | ICD-10-CM

## 2022-06-16 DIAGNOSIS — R531 Weakness: Secondary | ICD-10-CM

## 2022-06-16 DIAGNOSIS — N390 Urinary tract infection, site not specified: Secondary | ICD-10-CM

## 2022-06-16 DIAGNOSIS — G4751 Confusional arousals: Secondary | ICD-10-CM

## 2022-06-16 DIAGNOSIS — R110 Nausea: Secondary | ICD-10-CM

## 2022-06-16 LAB — CBC AND DIFFERENTIAL
Absolute NRBC: 0 10*3/uL (ref 0.00–0.00)
Basophils Absolute Automated: 0.07 10*3/uL (ref 0.00–0.08)
Basophils Automated: 0.8 %
Eosinophils Absolute Automated: 0.25 10*3/uL (ref 0.00–0.44)
Eosinophils Automated: 2.7 %
Hematocrit: 36.5 % (ref 34.7–43.7)
Hgb: 11.9 g/dL (ref 11.4–14.8)
Immature Granulocytes Absolute: 0.06 10*3/uL (ref 0.00–0.07)
Immature Granulocytes: 0.7 %
Instrument Absolute Neutrophil Count: 5.82 10*3/uL (ref 1.10–6.33)
Lymphocytes Absolute Automated: 2.31 10*3/uL (ref 0.42–3.22)
Lymphocytes Automated: 25.2 %
MCH: 29.8 pg (ref 25.1–33.5)
MCHC: 32.6 g/dL (ref 31.5–35.8)
MCV: 91.5 fL (ref 78.0–96.0)
MPV: 9.9 fL (ref 8.9–12.5)
Monocytes Absolute Automated: 0.65 10*3/uL (ref 0.21–0.85)
Monocytes: 7.1 %
Neutrophils Absolute: 5.82 10*3/uL (ref 1.10–6.33)
Neutrophils: 63.5 %
Nucleated RBC: 0 /100 WBC (ref 0.0–0.0)
Platelets: 231 10*3/uL (ref 142–346)
RBC: 3.99 10*6/uL (ref 3.90–5.10)
RDW: 16 % — ABNORMAL HIGH (ref 11–15)
WBC: 9.16 10*3/uL (ref 3.10–9.50)

## 2022-06-16 LAB — URINALYSIS, REFLEX TO MICROSCOPIC EXAM IF INDICATED
Bilirubin, UA: NEGATIVE
Blood, UA: NEGATIVE
Glucose, UA: NEGATIVE
Ketones UA: NEGATIVE
Leukocyte Esterase, UA: NEGATIVE
Nitrite, UA: NEGATIVE
Specific Gravity UA: 1.009 (ref 1.001–1.035)
Urine pH: 7.5 (ref 5.0–8.0)
Urobilinogen, UA: NORMAL mg/dL

## 2022-06-16 LAB — COMPREHENSIVE METABOLIC PANEL
ALT: 66 U/L — ABNORMAL HIGH (ref 0–55)
AST (SGOT): 62 U/L — ABNORMAL HIGH (ref 5–41)
Albumin/Globulin Ratio: 1.4 (ref 0.9–2.2)
Albumin: 3.7 g/dL (ref 3.5–5.0)
Alkaline Phosphatase: 76 U/L (ref 37–117)
Anion Gap: 10 (ref 5.0–15.0)
BUN: 11 mg/dL (ref 7.0–21.0)
Bilirubin, Total: 0.7 mg/dL (ref 0.2–1.2)
CO2: 29 mEq/L (ref 17–29)
Calcium: 8.9 mg/dL (ref 7.9–10.2)
Chloride: 101 mEq/L (ref 99–111)
Creatinine: 0.8 mg/dL (ref 0.4–1.0)
Globulin: 2.7 g/dL (ref 2.0–3.6)
Glucose: 107 mg/dL — ABNORMAL HIGH (ref 70–100)
Potassium: 3.8 mEq/L (ref 3.5–5.3)
Protein, Total: 6.4 g/dL (ref 6.0–8.3)
Sodium: 140 mEq/L (ref 135–145)
eGFR: >60 mL/min/{1.73_m2} (ref >=60)

## 2022-06-16 LAB — AMYLASE: Amylase: 55 U/L (ref 25–125)

## 2022-06-16 LAB — HEMOLYSIS INDEX: Hemolysis Index: 13 Index (ref 0–24)

## 2022-06-16 LAB — PT/INR
PT INR: 2.1 — ABNORMAL HIGH (ref 0.9–1.1)
PT: 24.8 s — ABNORMAL HIGH (ref 10.1–12.9)

## 2022-06-16 LAB — TSH: TSH: 3.77 u[IU]/mL (ref 0.35–4.94)

## 2022-06-16 LAB — VITAMIN B12: Vitamin B-12: 279 pg/mL (ref 211–911)

## 2022-06-16 LAB — LIPASE: Lipase: 32 U/L (ref 8–78)

## 2022-06-16 LAB — FERRITIN: Ferritin: 140.9 ng/mL (ref 4.60–204.00)

## 2022-06-18 LAB — IRON PROFILE
Iron Saturation: 27 % (calc) (ref 16–45)
Iron: 71 ug/dL (ref 45–160)
TIBC: 263 mcg/dL (calc) (ref 250–450)

## 2022-06-30 ENCOUNTER — Encounter (FREE_STANDING_LABORATORY_FACILITY): Payer: Medicare Other

## 2022-06-30 DIAGNOSIS — R748 Abnormal levels of other serum enzymes: Secondary | ICD-10-CM

## 2022-06-30 DIAGNOSIS — R109 Unspecified abdominal pain: Secondary | ICD-10-CM

## 2022-06-30 DIAGNOSIS — R110 Nausea: Secondary | ICD-10-CM

## 2022-06-30 DIAGNOSIS — Z7901 Long term (current) use of anticoagulants: Secondary | ICD-10-CM

## 2022-06-30 LAB — HEPATIC FUNCTION PANEL
ALT: 31 U/L (ref 0–55)
AST (SGOT): 31 U/L (ref 5–41)
Albumin/Globulin Ratio: 1.4 (ref 0.9–2.2)
Albumin: 3.3 g/dL — ABNORMAL LOW (ref 3.5–5.0)
Alkaline Phosphatase: 63 U/L (ref 37–117)
Bilirubin Direct: 0.2 mg/dL (ref 0.0–0.5)
Bilirubin Indirect: 0.3 mg/dL (ref 0.2–1.0)
Bilirubin, Total: 0.5 mg/dL (ref 0.2–1.2)
Globulin: 2.4 g/dL (ref 2.0–3.6)
Protein, Total: 5.7 g/dL — ABNORMAL LOW (ref 6.0–8.3)

## 2022-06-30 LAB — LIPID PANEL
Cholesterol / HDL Ratio: 3.9 Index
Cholesterol: 136 mg/dL (ref 0–199)
HDL: 35 mg/dL — ABNORMAL LOW (ref 40–9999)
LDL Calculated: 79 mg/dL (ref 0–99)
Triglycerides: 111 mg/dL (ref 34–149)
VLDL Calculated: 22 mg/dL (ref 10–40)

## 2022-06-30 LAB — HEMOLYSIS INDEX: Hemolysis Index: 8 Index (ref 0–24)

## 2022-06-30 LAB — PT/INR
PT INR: 1.1 (ref 0.9–1.1)
PT: 12.5 s (ref 10.1–12.9)

## 2022-07-07 ENCOUNTER — Encounter (FREE_STANDING_LABORATORY_FACILITY): Payer: Medicare Other

## 2022-07-07 DIAGNOSIS — Z7901 Long term (current) use of anticoagulants: Secondary | ICD-10-CM

## 2022-07-07 DIAGNOSIS — I482 Chronic atrial fibrillation, unspecified: Secondary | ICD-10-CM

## 2022-07-07 LAB — PT/INR
PT INR: 1.3 — ABNORMAL HIGH (ref 0.9–1.1)
PT: 14.6 s — ABNORMAL HIGH (ref 10.1–12.9)

## 2022-07-14 ENCOUNTER — Encounter (FREE_STANDING_LABORATORY_FACILITY): Payer: Medicare Other

## 2022-07-14 DIAGNOSIS — Z7901 Long term (current) use of anticoagulants: Secondary | ICD-10-CM

## 2022-07-14 DIAGNOSIS — I482 Chronic atrial fibrillation, unspecified: Secondary | ICD-10-CM

## 2022-07-14 LAB — PT/INR
PT INR: 1.6 — ABNORMAL HIGH (ref 0.9–1.1)
PT: 19.2 s — ABNORMAL HIGH (ref 10.1–12.9)

## 2022-07-17 ENCOUNTER — Other Ambulatory Visit: Payer: Self-pay | Admitting: Family

## 2022-07-21 ENCOUNTER — Encounter (FREE_STANDING_LABORATORY_FACILITY): Payer: Medicare Other

## 2022-07-21 DIAGNOSIS — Z7901 Long term (current) use of anticoagulants: Secondary | ICD-10-CM

## 2022-07-21 DIAGNOSIS — I482 Chronic atrial fibrillation, unspecified: Secondary | ICD-10-CM

## 2022-07-21 LAB — PT/INR
PT INR: 1.3 — ABNORMAL HIGH (ref 0.9–1.1)
PT: 14.9 s — ABNORMAL HIGH (ref 10.1–12.9)

## 2022-07-27 ENCOUNTER — Encounter (FREE_STANDING_LABORATORY_FACILITY): Payer: Medicare Other

## 2022-07-27 DIAGNOSIS — N390 Urinary tract infection, site not specified: Secondary | ICD-10-CM

## 2022-07-28 ENCOUNTER — Encounter (FREE_STANDING_LABORATORY_FACILITY): Payer: Medicare Other

## 2022-07-28 DIAGNOSIS — R109 Unspecified abdominal pain: Secondary | ICD-10-CM

## 2022-07-28 DIAGNOSIS — F5000 Anorexia nervosa, unspecified: Secondary | ICD-10-CM

## 2022-07-28 DIAGNOSIS — R748 Abnormal levels of other serum enzymes: Secondary | ICD-10-CM

## 2022-07-28 DIAGNOSIS — R3589 Other polyuria: Secondary | ICD-10-CM

## 2022-07-28 LAB — CBC AND DIFFERENTIAL
Absolute NRBC: 0 10*3/uL (ref 0.00–0.00)
Basophils Absolute Automated: 0.09 10*3/uL — ABNORMAL HIGH (ref 0.00–0.08)
Basophils Automated: 1 %
Eosinophils Absolute Automated: 0.26 10*3/uL (ref 0.00–0.44)
Eosinophils Automated: 2.9 %
Hematocrit: 38.5 % (ref 34.7–43.7)
Hgb: 12.5 g/dL (ref 11.4–14.8)
Immature Granulocytes Absolute: 0.05 10*3/uL (ref 0.00–0.07)
Immature Granulocytes: 0.6 %
Instrument Absolute Neutrophil Count: 6.23 10*3/uL (ref 1.10–6.33)
Lymphocytes Absolute Automated: 1.75 10*3/uL (ref 0.42–3.22)
Lymphocytes Automated: 19.4 %
MCH: 29.8 pg (ref 25.1–33.5)
MCHC: 32.5 g/dL (ref 31.5–35.8)
MCV: 91.7 fL (ref 78.0–96.0)
MPV: 9.3 fL (ref 8.9–12.5)
Monocytes Absolute Automated: 0.65 10*3/uL (ref 0.21–0.85)
Monocytes: 7.2 %
Neutrophils Absolute: 6.23 10*3/uL (ref 1.10–6.33)
Neutrophils: 68.9 %
Nucleated RBC: 0 /100 WBC (ref 0.0–0.0)
Platelets: 327 10*3/uL (ref 142–346)
RBC: 4.2 10*6/uL (ref 3.90–5.10)
RDW: 15 % (ref 11–15)
WBC: 9.03 10*3/uL (ref 3.10–9.50)

## 2022-07-28 LAB — IRON PROFILE
Iron Saturation: 20 % (ref 15–50)
Iron: 51 ug/dL (ref 32–157)
TIBC: 260 ug/dL — ABNORMAL LOW (ref 265–497)
UIBC: 209 ug/dL (ref 126–382)

## 2022-07-28 LAB — HEPATITIS PANEL, ACUTE
Hep A IgM: NONREACTIVE
Hepatitis B Core IgM: NONREACTIVE
Hepatitis B Surface Antigen: NONREACTIVE
Hepatitis C, AB: NONREACTIVE

## 2022-07-28 LAB — URINALYSIS, REFLEX TO MICROSCOPIC EXAM IF INDICATED
Bilirubin, UA: NEGATIVE
Blood, UA: NEGATIVE
Glucose, UA: NEGATIVE
Ketones UA: NEGATIVE
Leukocyte Esterase, UA: NEGATIVE
Nitrite, UA: NEGATIVE
Protein, UR: NEGATIVE
Specific Gravity UA: 1.007 (ref 1.001–1.035)
Urine pH: 8 (ref 5.0–8.0)
Urobilinogen, UA: NORMAL mg/dL

## 2022-07-28 LAB — COMPREHENSIVE METABOLIC PANEL
ALT: 52 U/L (ref 0–55)
AST (SGOT): 43 U/L — ABNORMAL HIGH (ref 5–41)
Albumin/Globulin Ratio: 1.2 (ref 0.9–2.2)
Albumin: 3.6 g/dL (ref 3.5–5.0)
Alkaline Phosphatase: 78 U/L (ref 37–117)
Anion Gap: 10 (ref 5.0–15.0)
BUN: 10 mg/dL (ref 7.0–21.0)
Bilirubin, Total: 0.8 mg/dL (ref 0.2–1.2)
CO2: 32 mEq/L — ABNORMAL HIGH (ref 17–29)
Calcium: 8.7 mg/dL (ref 7.9–10.2)
Chloride: 94 mEq/L — ABNORMAL LOW (ref 99–111)
Creatinine: 0.8 mg/dL (ref 0.4–1.0)
Globulin: 3.1 g/dL (ref 2.0–3.6)
Glucose: 117 mg/dL — ABNORMAL HIGH (ref 70–100)
Potassium: 3.2 mEq/L — ABNORMAL LOW (ref 3.5–5.3)
Protein, Total: 6.7 g/dL (ref 6.0–8.3)
Sodium: 136 mEq/L (ref 135–145)
eGFR: >60 mL/min/{1.73_m2} (ref >=60)

## 2022-07-28 LAB — PSA: Prostate Specific Antigen, Total: <0.1 ng/mL

## 2022-07-28 LAB — HEMOLYSIS INDEX: Hemolysis Index: 1 Index (ref 0–24)

## 2022-07-28 LAB — PT/INR
PT INR: 2 — ABNORMAL HIGH (ref 0.9–1.1)
PT: 23.6 s — ABNORMAL HIGH (ref 10.1–12.9)

## 2022-07-28 LAB — FERRITIN: Ferritin: 127.4 ng/mL (ref 4.60–204.00)

## 2022-08-11 ENCOUNTER — Encounter (FREE_STANDING_LABORATORY_FACILITY): Payer: Medicare Other

## 2022-08-11 DIAGNOSIS — I4891 Unspecified atrial fibrillation: Secondary | ICD-10-CM

## 2022-08-11 LAB — PT/INR
PT INR: 8 (ref 0.9–1.1)
PT: 94.5 s — ABNORMAL HIGH (ref 10.1–12.9)

## 2022-08-18 ENCOUNTER — Encounter (FREE_STANDING_LABORATORY_FACILITY): Payer: Medicare Other

## 2022-08-18 DIAGNOSIS — Z7901 Long term (current) use of anticoagulants: Secondary | ICD-10-CM

## 2022-08-18 DIAGNOSIS — E139 Other specified diabetes mellitus without complications: Secondary | ICD-10-CM

## 2022-08-18 DIAGNOSIS — F5000 Anorexia nervosa, unspecified: Secondary | ICD-10-CM

## 2022-08-18 DIAGNOSIS — R748 Abnormal levels of other serum enzymes: Secondary | ICD-10-CM

## 2022-08-18 LAB — CBC AND DIFFERENTIAL
Absolute NRBC: 0 10*3/uL (ref 0.00–0.00)
Basophils Absolute Automated: 0.05 10*3/uL (ref 0.00–0.08)
Basophils Automated: 0.6 %
Eosinophils Absolute Automated: 0.27 10*3/uL (ref 0.00–0.44)
Eosinophils Automated: 3.1 %
Hematocrit: 35.6 % (ref 34.7–43.7)
Hgb: 11.4 g/dL (ref 11.4–14.8)
Immature Granulocytes Absolute: 0.08 10*3/uL — ABNORMAL HIGH (ref 0.00–0.07)
Immature Granulocytes: 0.9 %
Instrument Absolute Neutrophil Count: 5.96 10*3/uL (ref 1.10–6.33)
Lymphocytes Absolute Automated: 1.84 10*3/uL (ref 0.42–3.22)
Lymphocytes Automated: 20.8 %
MCH: 29.4 pg (ref 25.1–33.5)
MCHC: 32 g/dL (ref 31.5–35.8)
MCV: 91.8 fL (ref 78.0–96.0)
MPV: 10.2 fL (ref 8.9–12.5)
Monocytes Absolute Automated: 0.65 10*3/uL (ref 0.21–0.85)
Monocytes: 7.3 %
Neutrophils Absolute: 5.96 10*3/uL (ref 1.10–6.33)
Neutrophils: 67.3 %
Nucleated RBC: 0 /100 WBC (ref 0.0–0.0)
Platelets: 243 10*3/uL (ref 142–346)
RBC: 3.88 10*6/uL — ABNORMAL LOW (ref 3.90–5.10)
RDW: 15 % (ref 11–15)
WBC: 8.85 10*3/uL (ref 3.10–9.50)

## 2022-08-18 LAB — COMPREHENSIVE METABOLIC PANEL
ALT: 59 U/L — ABNORMAL HIGH (ref 0–55)
AST (SGOT): 48 U/L — ABNORMAL HIGH (ref 5–41)
Albumin/Globulin Ratio: 1.3 (ref 0.9–2.2)
Albumin: 3.5 g/dL (ref 3.5–5.0)
Alkaline Phosphatase: 66 U/L (ref 37–117)
Anion Gap: 8 (ref 5.0–15.0)
BUN: 13 mg/dL (ref 7.0–21.0)
Bilirubin, Total: 0.6 mg/dL (ref 0.2–1.2)
CO2: 31 mEq/L — ABNORMAL HIGH (ref 17–29)
Calcium: 9.3 mg/dL (ref 7.9–10.2)
Chloride: 102 mEq/L (ref 99–111)
Creatinine: 0.8 mg/dL (ref 0.4–1.0)
Globulin: 2.7 g/dL (ref 2.0–3.6)
Glucose: 122 mg/dL — ABNORMAL HIGH (ref 70–100)
Potassium: 3.3 mEq/L — ABNORMAL LOW (ref 3.5–5.3)
Protein, Total: 6.2 g/dL (ref 6.0–8.3)
Sodium: 141 mEq/L (ref 135–145)
eGFR: >60 mL/min/{1.73_m2} (ref >=60)

## 2022-08-18 LAB — HEMOLYSIS INDEX(SOFT): Hemolysis Index: 12 Index (ref 0–24)

## 2022-08-18 LAB — HEMOGLOBIN A1C
Average Estimated Glucose: 128.4 mg/dL
Hemoglobin A1C: 6.1 % — ABNORMAL HIGH (ref 4.6–5.6)

## 2022-08-18 LAB — PT/INR
PT INR: 1.1 (ref 0.9–1.1)
PT: 12.5 s (ref 10.1–12.9)

## 2022-08-25 ENCOUNTER — Encounter (FREE_STANDING_LABORATORY_FACILITY): Payer: Medicare Other

## 2022-08-25 DIAGNOSIS — E876 Hypokalemia: Secondary | ICD-10-CM

## 2022-08-25 LAB — POTASSIUM: Potassium: 3.6 mEq/L (ref 3.5–5.3)

## 2022-08-25 LAB — HEMOLYSIS INDEX(SOFT): Hemolysis Index: 8 Index (ref 0–24)

## 2022-09-01 ENCOUNTER — Ambulatory Visit (INDEPENDENT_AMBULATORY_CARE_PROVIDER_SITE_OTHER): Payer: Medicare Other | Admitting: Physician Assistant

## 2022-09-01 ENCOUNTER — Encounter (INDEPENDENT_AMBULATORY_CARE_PROVIDER_SITE_OTHER): Payer: Self-pay | Admitting: Physician Assistant

## 2022-09-01 ENCOUNTER — Other Ambulatory Visit (INDEPENDENT_AMBULATORY_CARE_PROVIDER_SITE_OTHER): Payer: Self-pay | Admitting: Physician Assistant

## 2022-09-01 VITALS — BP 120/62 | HR 67 | Ht 64.0 in | Wt 114.0 lb

## 2022-09-01 DIAGNOSIS — I480 Paroxysmal atrial fibrillation: Secondary | ICD-10-CM

## 2022-09-01 DIAGNOSIS — Z950 Presence of cardiac pacemaker: Secondary | ICD-10-CM | POA: Insufficient documentation

## 2022-09-01 DIAGNOSIS — I951 Orthostatic hypotension: Secondary | ICD-10-CM

## 2022-09-01 DIAGNOSIS — R296 Repeated falls: Secondary | ICD-10-CM | POA: Insufficient documentation

## 2022-09-01 DIAGNOSIS — Z45018 Encounter for adjustment and management of other part of cardiac pacemaker: Secondary | ICD-10-CM | POA: Insufficient documentation

## 2022-09-01 DIAGNOSIS — E785 Hyperlipidemia, unspecified: Secondary | ICD-10-CM

## 2022-09-01 DIAGNOSIS — E1165 Type 2 diabetes mellitus with hyperglycemia: Secondary | ICD-10-CM | POA: Insufficient documentation

## 2022-09-01 DIAGNOSIS — Z8673 Personal history of transient ischemic attack (TIA), and cerebral infarction without residual deficits: Secondary | ICD-10-CM | POA: Insufficient documentation

## 2022-09-01 DIAGNOSIS — I495 Sick sinus syndrome: Secondary | ICD-10-CM | POA: Insufficient documentation

## 2022-09-01 DIAGNOSIS — E039 Hypothyroidism, unspecified: Secondary | ICD-10-CM

## 2022-09-01 LAB — ECG 12-LEAD
Atrial Rate: 67 {beats}/min
P Axis: 89 degrees
P-R Interval: 260 ms
Q-T Interval: 406 ms
QRS Duration: 92 ms
QTC Calculation (Bezet): 429 ms
R Axis: -86 degrees
T Axis: -47 degrees
Ventricular Rate: 67 {beats}/min

## 2022-09-01 LAB — IN OFFICE CARDIAC DEVICE MONITORING: RV Pacing Percentage: 6.1

## 2022-09-01 NOTE — Progress Notes (Signed)
HEART ELECTROPHYSIOLOGY OFFICE PROGRESS NOTE    HRT [facility] OFFICE -CARDIOLOGY  [address]  [place] [address]  Dept: [phone]  Dept Fax: [phone]       Patient Name: Margaret Shea    Date of Visit:  September 01, 2022  Date of Birth: [birth year]  AGE: 89 y.o.  Medical Record #: [id number]    Primary Electrophysiologist: Ida Rogue MD, [facility]    Primary Cardiologist: Era Skeen MD, [facility]    CHIEF COMPLAINT:  Follow-up (Annual EP F/U of PAF and SSS S/P St Jude PPM )    HISTORY OF PRESENT ILLNESS:    She is a 89 y.o. female who is seen today as an annual EP follow-up for sick sinus syndrome status post [facility] Jude dual-chamber pacemaker implant, and paroxysmal atrial fibrillation.  She also has a history of frequent falls, and due to these recurrent falls she was recently taken off of warfarin and her son would like to discuss whether or not anticoagulation would be reasonable.  She has been dealing with grief from her husband's recent passing, and notes that in this setting she has increased fatigue.  She denies chest pain, palpitations, dizziness, near syncope, frank syncope.    PAST MEDICAL HISTORY: She has a past medical history of Arrhythmia, Atrial fibrillation, Atrial tachycardia, Chest pain, Constipation, Gastroesophageal reflux disease, Hyperlipidemia, Hypothyroidism, Left arm pain, Pacemaker, PNA (pneumonia) (2016), Prolapse of uterus, SOB (shortness of breath), Tinnitus, Urinary tract infection (12/2015), Ventricular tachycardia, and Vertigo. She has a past surgical history that includes Back surgery (1980); Mohs surgery; Insert / replace / remove pacemaker (October 2011); Dental surgery; Cardiac catheterization (08/16/2015); Hysterectomy (2004); Adenoidectomy; Tonsillectomy; COLPECTOMY (N/A, 02/07/2016); CYSTOSCOPY (N/A, 02/07/2016); ECHOCARDIOGRAM, TRANSTHORACIC (05/07/2019); and PET MPI Study (01/28/2019).    ALLERGIES:   Allergies    Allergen Reactions    Flecainide      Tinnitus,     Propafenone      dizziness     MEDICATIONS:   Current Outpatient Medications:     calcium carbonate (OS-CAL) 600 MG Tab tablet, Take 1 tablet (600 mg) by mouth 2 (two) times daily with meals, Disp: , Rfl:     Cholecalciferol (VITAMIN D PO), Take 1 tablet by mouth daily, Disp: , Rfl:     Cyanocobalamin (VITAMIN B 12) 250 MCG Lozenge, Take 1 tablet by mouth daily, Disp: , Rfl:     escitalopram (LEXAPRO) 10 MG tablet, Take 1 tablet (10 mg) by mouth daily, Disp: , Rfl:     esomeprazole (NEXIUM) 20 MG capsule, Take 1 capsule (20 mg) by mouth every morning before breakfast, Disp: , Rfl:     estradiol (ESTRACE) 0.1 MG/GM vaginal cream, Place 1 g vaginally twice a week, Disp: , Rfl:     famotidine (PEPCID) 20 MG tablet, TAKE ONE TABLET BY MOUTH TWICE A DAY (Patient taking differently: 2 (two) times daily as needed), Disp: 180 tablet, Rfl: 3    fludrocortisone (FLORINEF) 0.1 MG tablet, Take 1 tablet (0.1 mg) by mouth daily, Disp: , Rfl:     levothyroxine (SYNTHROID, LEVOTHROID) 50 MCG tablet, Take 1 tablet (50 mcg) by mouth every morning, Disp: , Rfl:     loratadine (CLARITIN) 10 MG tablet, Take 1 tablet (10 mg) by mouth daily, Disp: , Rfl:     metFORMIN (FORTAMET) 500 MG (OSM) 24 hr tablet, Take 1 tablet (500 mg) by mouth every morning with breakfast 1/2 tab po qd, Disp: , Rfl:  metoprolol tartrate (LOPRESSOR) 25 MG tablet, Take 1 tablet (25 mg) by mouth as needed (For afib), Disp: 90 tablet, Rfl: 3    nitroglycerin (NITROSTAT) 0.4 MG SL tablet, Place 1 tablet (0.4 mg total) under the tongue as needed for Chest pain (q63min x3. call MD or 911 if pain persists)., Disp: 25 tablet, Rfl: 3    rosuvastatin (CRESTOR) 20 MG tablet, Take 1 tablet (20 mg) by mouth daily, Disp: , Rfl:     senna-docusate (PERICOLACE) 8.6-50 MG per tablet, Take 2 tablets by mouth daily. (Patient taking differently: Take 1 tablet by mouth as needed for Constipation), Disp: 30 tablet, Rfl: 0     sodium bicarbonate 650 MG tablet, Take 1 tablet (650 mg) by mouth 2 (two) times daily, Disp: , Rfl:   Patient's current medications were reviewed. ONLY Cardiac medications were updated unless others were addressed in assessment and plan.  PHYSICAL EXAMINATION  Visit Vitals  BP 120/62 (BP Site: Right arm, Patient Position: Sitting, Cuff Size: Medium)   Pulse 67   Ht 1.626 m (5\' 4" )   Wt 51.7 kg (114 lb)   LMP  (LMP Unknown)   BMI 19.57 kg/m      General Appearance:  A well-appearing female in no acute distress.    Neck: JVP normal, no carotid bruit, thyroid not enlarged   Chest: Clear to auscultation bilaterally with good air movement and respiratory effort and no wheezes, rales, or rhonchi   Cardiovascular: Regular rhythm, S1 normal, S2 normal, No S3 or S4. Apical impulse not displaced,. No murmur. No gallops or rubs detected   Extremities: Bilateral radial pulses 2+   Neuro: Alert and oriented x3.    ECG independently reviewed and interpreted by me: ApVs @ 67 bpm.  Corrected QT interval of 44msec.      LABS:   Lab Results   Component Value Date    WBC 8.85 08/18/2022    HGB 11.4 08/18/2022    HCT 35.6 08/18/2022    PLT 243 08/18/2022     Lab Results   Component Value Date    GLU 122 (H) 08/18/2022    BUN 13.0 08/18/2022    CREAT 0.8 08/18/2022    NA 141 08/18/2022    K 3.6 08/25/2022    CL 102 08/18/2022    CO2 31 (H) 08/18/2022    CA 9.3 08/18/2022    PROT 6.2 08/18/2022    AST 48 (H) 08/18/2022    ALT 59 (H) 08/18/2022    BILITOTAL 0.6 08/18/2022    GLOB 2.7 08/18/2022    ALB 3.5 08/18/2022     Lab Results   Component Value Date    MG 2.0 05/20/2021    TSH 3.77 06/16/2022    HGBA1C 6.1 (H) 08/18/2022     Estimated Creatinine Clearance: 39.7 mL/min (based on SCr of 0.8 mg/dL).  I have personally reviewed and independently interpreted the above labwork    IMPRESSION:   Margaret Shea is a 89 y.o. female with the following problems:    Sick sinus syndrome status post [place] dual-chamber pacemaker implant 24 June 2010, status post generator change 01 October 2020.  Her pacemaker is MRI conditional.  Interrogation of her pacemaker today out of medical necessity which included manual, and iterative programming revealed she has stable battery and lead parameters with battery longevity is estimated at 7.8 years.  There were no arrhythmias noted.  She is programmed in the DDDR mode lower rate of 60 upper tracking  of 130.  She is currently 96% atrially paced and 6.1% ventricularly paced.  Her underlying rhythm today was sinus arrest with no ventricular escape at VVI 30, but she does have intact A-V conduction.  Detailed interrogation is available to cardiac procedures tab.  Paroxysmal atrial fibrillation-no recent recurrences  Not on anticoagulation with CHA2DS2-VASc Score: at least 6 (Age > 75 (2), DM, Stroke/Thromboembolism (2), and Female Sex), due to frequent falls and recurrent admissions for traumatic injuries from falls.  History of TIA in the past  Echocardiogram 17 April 2022 with a EF of 60% (Simpson's), borderline LAE with LAVI of 34 mL/m, mild MR, mild to moderate TR, RVSP 28 mmHg.  Lexiscan 11 August 2015 with small mild intensity reversible defect involving anterior wall suggestive of ischemia.  Orthostatic hypotension  Hyperlipidemia  Diabetes  Hypothyroid  Wheelchair-bound  Advanced age now 57  Fatigue in setting of grief with recent passing of her husband    RECOMMENDATIONS:    From an arrhythmia standpoint Ms. Hakala continues to do well and has not had any further arrhythmias.  I did review with her son in great detail that given her frequent falls particularly resulting in traumatic injuries, from risk-benefit standpoint I do feel that it is warranted to avoid anticoagulation.  I also reviewed with him that her remote monitoring is currently under [facility], but I will request it be transferred to [place] that she has transferred all of her care to [facility].  Once we have the  remote connection established, and it is verified functional, I will contact her son to notify him that it is working.  I otherwise recommended no other changes and recommended she follow-up in 1 years time with Dr. Loraine Leriche or an electrophysiology advanced practice provider.  In the interim we will continue vigilant surveillance via [place].                                               Orders Placed This Encounter   Procedures    ECG 12 lead (Normal)    EP Office Visit (HRT [place])     SIGNED:    Karis Juba PhD, DrPH, DMSc, PA-C   Cardiac Electrophysiology   [facility]    This note was generated by the Dragon speech recognition and may contain errors or omissions not intended by the user. Grammatical errors, random word insertions, deletions, pronoun errors, and incomplete sentences are occasional consequences of this technology due to software limitations. Not all errors are caught or corrected. If there are questions or concerns about the content of this note or information contained within the body of this dictation, they should be addressed directly with the author for clarification.

## 2022-09-06 DIAGNOSIS — I495 Sick sinus syndrome: Secondary | ICD-10-CM

## 2022-09-06 DIAGNOSIS — Z950 Presence of cardiac pacemaker: Secondary | ICD-10-CM

## 2022-09-06 LAB — REMOTE CARDIAC DEVICE MONITORING
AF Burden Percentage: 0
RV Pacing Percentage: 5.7

## 2022-09-12 ENCOUNTER — Other Ambulatory Visit (INDEPENDENT_AMBULATORY_CARE_PROVIDER_SITE_OTHER): Payer: Self-pay | Admitting: Cardiovascular Disease

## 2022-10-05 ENCOUNTER — Encounter (INDEPENDENT_AMBULATORY_CARE_PROVIDER_SITE_OTHER): Payer: Medicare Other | Admitting: Nurse Practitioner

## 2022-10-16 ENCOUNTER — Emergency Department: Payer: Medicare Other

## 2022-10-16 ENCOUNTER — Emergency Department
Admission: EM | Admit: 2022-10-16 | Discharge: 2022-10-17 | Disposition: A | Discharge status: Home / Self Care | Payer: Medicare Other | Attending: Emergency Medicine | Admitting: Emergency Medicine

## 2022-10-16 DIAGNOSIS — S0101XA Laceration without foreign body of scalp, initial encounter: Secondary | ICD-10-CM | POA: Insufficient documentation

## 2022-10-16 DIAGNOSIS — W19XXXA Unspecified fall, initial encounter: Secondary | ICD-10-CM

## 2022-10-16 DIAGNOSIS — W1830XA Fall on same level, unspecified, initial encounter: Secondary | ICD-10-CM | POA: Insufficient documentation

## 2022-10-16 DIAGNOSIS — Y92129 Unspecified place in nursing home as the place of occurrence of the external cause: Secondary | ICD-10-CM | POA: Insufficient documentation

## 2022-10-16 DIAGNOSIS — S0003XA Contusion of scalp, initial encounter: Secondary | ICD-10-CM

## 2022-10-16 DIAGNOSIS — M542 Cervicalgia: Secondary | ICD-10-CM | POA: Insufficient documentation

## 2022-10-16 MED ORDER — ACETAMINOPHEN 500 MG PO TABS
1000.0000 mg | ORAL_TABLET | Freq: Once | ORAL | Status: AC
Start: 2022-10-16 — End: 2022-10-16
  Administered 2022-10-16: 1000 mg via ORAL
  Filled 2022-10-16: qty 2

## 2022-10-16 NOTE — ED Provider Notes (Addendum)
EMERGENCY DEPARTMENT HISTORY AND PHYSICAL EXAM    Date Time: 10/16/22 6:53 PM  Patient Name: Margaret Shea  Attending Physician: Veneta Penton. Sherral Hammers, MD      Clinical course in ED:   Final diagnoses:  Final diagnoses:   Hematoma of scalp, initial encounter   Fall, initial encounter       ED Disposition:   ED Disposition       ED Disposition   Discharge    Condition   --    Date/Time   Mon Oct 16, 2022 10:00 PM    [place] discharge to home/self care.    Condition at disposition: Stable                 Time-based events in ED/MDM:  ED Course as of 10/16/22 2254   Mon Oct 16, 2022   2159 CT brain/c spine unrem for acute injury, wound care completed, tet UTD, f/u prn.  [AW]      ED Course User Index  [AW] Margaret Chandler, MD       Medications given in ED:  ED Medication Orders (From admission, onward)      Start Ordered     Status Ordering Provider    10/16/22 2231 10/16/22 2230  acetaminophen (TYLENOL) tablet 1,000 mg  Once        Route: Oral  Ordered Dose: 1,000 mg       Last MAR action: Given Gemini Bunte H            History of Presenting Illness:     RN triage note:  Triage Note: Patient presents to ED via EMS from [facility] ALF after she got up during dinner to grab a nutrition drink and fell straight back on her head and back. Patient was down for approximately 15 minutes until staff found her. Patient denies LOC. Patient is on blood thinners. Patient has large laceration to right back of head with dressing applied to control bleeding. Patient is not on thinners. Patient is complaining of neck pain, Aspen collar applied by RN at this time. Patient is complaining of right knee pain and head pain as well. Patient is A&OX3 confused to year, per ALF staff that is baseline. (10/16/22 1844)    MD HPI:    Chief Complaint: head injury  History obtained from: ems/pt  Onset/Duration: just pta  Quality: as below  Severity:mod  Aggravating Factors: none  Alleviating Factors: none  Associated Symptoms: As  below  Narrative/Additional Historical Findings: Margaret Shea brought by EMS after fall from home.  Patient was having dinner and got up from table to get something and had mechanical fall.  She is not on anticoagulants.  She did not lose consciousness.  Sustained laceration to posterior scalp.  Complains of headache and neck pain.  Is otherwise awake, alert, able to answer questions. Denies R knee pain at this time.       Past Medical History:     Past Medical History:   Diagnosis Date    Arrhythmia     Bradycardia    Atrial fibrillation     Atrial tachycardia     Chest pain     Constipation     Gastroesophageal reflux disease     on med    Hyperlipidemia     Hypothyroidism     Left arm pain     Pacemaker     PNA (pneumonia) 2016    hospitalized over night  for pneumonia    Prolapse of uterus     SOB (shortness of breath)     Tinnitus     Urinary tract infection 12/2015    on med, no symptoms presently    Ventricular tachycardia     Vertigo        Past Surgical History:     Past Surgical History:   Procedure Laterality Date    ADENOIDECTOMY      age7    [place]  08/16/2015    COLPECTOMY N/A 02/07/2016    Procedure: COLPECTOMY, TVT SLING;  Surgeon: Reymundo Poll, MD;  Location: [facility] OR;  Service: Gynecology Urology;  Laterality: N/A;    CYSTOSCOPY N/A 02/07/2016    Procedure: CYSTOSCOPY;  Surgeon: Reymundo Poll, MD;  Location: [facility] OR;  Service: Gynecology Urology;  Laterality: N/A;    DENTAL SURGERY      Root canal    ECHOCARDIOGRAM, TRANSTHORACIC  05/07/2019    EF 64.3%    HYSTERECTOMY  2004    INSERT / REPLACE / REMOVE PACEMAKER  October 2011    MOHS SURGERY      PET MPI Study  01/28/2019    EF 76% normal study    TONSILLECTOMY      age 89       Family History:     Family History   Problem Relation Age of Onset    Myocardial Infarction Father     Coronary artery disease Father     Hyperlipidemia Father     Hyperlipidemia Sister     Hypertension Neg Hx         Social History:     Social History     Socioeconomic History    Marital status: Widowed     Spouse name: Not on file    Number of children: Not on file    Years of education: Not on file    Highest education level: Not on file   Occupational History    Not on file   Tobacco Use    Smoking status: Never    Smokeless tobacco: Never   Vaping Use    Vaping Use: Never used   Substance and Sexual Activity    Alcohol use: Not Currently     Alcohol/week: 1.0 standard drink of alcohol     Types: 1 Glasses of wine per week     Comment: Glass of wine occasionally    Drug use: No    Sexual activity: Not on file   Other Topics Concern    Not on file   Social History Narrative    Not on file     Social Determinants of Health     Financial Resource Strain: Not on file   Food Insecurity: No Food Insecurity (10/16/2022)    Hunger Vital Sign     Worried About Running Out of Food in the Last Year: Never true     Ran Out of Food in the Last Year: Never true   Transportation Needs: No Transportation Needs (10/16/2022)    PRAPARE - Armed forces logistics/support/administrative officer (Medical): No     Lack of Transportation (Non-Medical): No   Physical Activity: Not on file   Stress: Not on file   Social Connections: Not on file   Intimate Partner Violence: Not At Risk (10/16/2022)    Humiliation, Afraid, Rape, and Kick questionnaire     Fear of  Current or Ex-Partner: No     Emotionally Abused: No     Physically Abused: No     Sexually Abused: No   Housing Stability: Unknown (10/16/2022)    Housing Stability Vital Sign     Unable to Pay for Housing in the Last Year: No     Number of Places Lived in the Last Year: Not on file     Unstable Housing in the Last Year: No       Allergies:     Allergies   Allergen Reactions    Flecainide      Tinnitus,     Propafenone      dizziness       Medications:     (Not in a hospital admission)      Review of Systems:     Pertinent Positives and Negatives noted in the HPI.  All Other Systems Reviewed and  Negative: Yes    Physical Exam:     Vitals:    10/16/22 2030   BP: 164/78   Pulse: 60   Resp: (!) 24   Temp:    SpO2: 95%       Constitutional: Vital signs reviewed. Pt is fatigued but non-toxic.   Head: Normocephalic, 3 cm hematoma to post scalp with abrasion and minor skin disruption, tissue is boggy, no true laceration or disruption to dermis  Eyes: No conjunctival injection. No discharge.  ENT: Mucous membranes moist  Neck: Normal range of motion. Non-tender.  Respiratory/Chest: Clear to auscultation. No respiratory distress.   Cardiovascular: Regular rate and rhythm. No murmur.   Abdomen: Soft and non-tender. No guarding. No masses or hepatosplenomegaly.  Back: Nrml appearance, no flank ttp, no midline ttp or masses.   LowerExtremity: No edema. No cyanosis.  Neurological: No focal motor deficits. Speech normal. Memory poor.  Msk: Nrml ROM, non-ttp  Skin: Warm and dry. No rash.  Lymphatic: No cervical lymphadenopathy.    Labs:     Results       ** No results found for the last 24 hours. **              Rads:     CT Head WO Contrast   Final Result          1. No CT evidence of acute intracranial abnormality.   2. Cerebral volume loss, intracranial atherosclerosis and moderate sequela   of chronic small vessel ischemic disease. No change in the appearance of   the brain when compared to the study of 05/02/2022.      Peggye Ley, MD   10/16/2022 8:50 PM      CT Cervical Spine without Contrast   Final Result          No acute cervical spine fracture or traumatic malalignment. Cervical   spondylosis and facet arthrosis. Carotid atherosclerosis.      Peggye Ley, MD   10/16/2022 8:53 PM          Medical Decision Making:   I reviewed the vital signs, nursing notes, past medical history, past surgical history, family history and social history.  Vital Signs - Patient Vitals for the past 12 hrs:   BP Temp Pulse Resp   10/16/22 2030 164/78 -- 60 (!) 24   10/16/22 1844 186/85 (!) 96.8 F (36 C) 70 12        Procedures:  Procedures    Interpretations:    Differential Diagnosis (not completely inclusive): ICH, skull fx, spinal injury, thoracoabd injury,  long bone fx, soft tissue injury, other    Rhythm Strip Interpretation / Cardiac Monitor Analysis, Independently Interpreted by me: Yes: SR   Pulse Readings from Last 1 Encounters:   10/16/22 60       Pulse Ox Analysis, Independently Interpreted by me: Yes: saturation: 96 %; Oxygen use: room air; Interpretation: Normal      EKG was Reviewed, Analyzed and Independently Interpreted by me: N/A    Attestations:       This note and the patient instructions accurately reflect work and decisions made by me.     Signed by: Veneta Penton Sherral Hammers, MD       Margaret Chandler, MD  10/16/22 [place], Mattison Golay H, MD  10/16/22 [phone]

## 2022-10-16 NOTE — EDIE (Signed)
[facility]?NOTIFICATION?10/16/2022 18:41?Delaynee, Edquist M?MRN: [id number]    Criteria Met      5 ED Visits in 12 Months    Security and Safety  No Security Events were found.  ED Care Guidelines  There are currently no ED Care Guidelines for this patient. Please check your facility's medical records system.        Prescription Monitoring Program  090 ??- Narcotic Use Score   040 ??- Sedative Use Score   000 ??- Stimulant Use Score   010??- Overdose Risk Score  - All Scores range from 000-999 with 75% of the population scoring < 200 and only 1% scoring above 650  - The last digit of the narcotic, sedative, and stimulant score indicates the number of active prescriptions of that type  - Higher Use scores correlate with increased prescribers, pharmacies, mg equiv, and overlapping prescriptions   - Higher Overdose Risk Scores correlate with increased risk of unintentional overdose death   Concerning or unexpectedly high scores should prompt a review of the PMP record; this does not constitute checking PMP for prescribing purposes.    E.D. Visit Count (12 mo.)  Facility Visits   [facility] 2   Total 6   Note: Visits indicate total known visits.     Recent Emergency Department Visit Summary  Date Facility [facility] Type Diagnoses or Chief Complaint    Oct 16, 2022  [place]  [place].  [place]  Emergency      [place]: RTE #2      Aug 12, 2022  [place].  [place]  Emergency      Unspecified fall, initial encounter      Abnormal coagulation profile      [address]      May 11, 2022  [place]. [place].  [place]  Emergency      Wedge compression fracture of second lumbar vertebra, initial encounter for closed fracture      Unspecified fall, initial encounter      Fall      May 02, 2022  [place].  [place].  [place]  Emergency      Urinary tract infection, site not specified      Unspecified Escherichia coli [E. coli] as the cause  of diseases classified elsewhere      Urinary Tract Infection Symptoms      Medic: UTI      Mar 11, 2022  [place]. [place].  [place]  Emergency      Personal history of other diseases of the circulatory system      Syncope and collapse      Hypokalemia      Dizziness and giddiness      Dizziness      A fib, near syncope      Nov 18, 2021  [place]. [place].  [place]  Emergency      Acute candidiasis of vulva and vagina      Type 2 diabetes mellitus with hyperglycemia      Other fatigue      Hyperglycemia        Recent Inpatient Visit Summary  Date [place] Type Diagnoses or Chief Complaint    Mar 13, 2022  [place].  [place]  Inpatient      Ventricular  tachycardia, unspecified      Other ventricular tachycardia      Dizziness and giddiness      Personal history of other diseases of the circulatory system      Syncope and collapse      Hypokalemia      Mar 11, 2022  [place].  [place]  Inpatient      Other specified abnormal findings of blood chemistry      Ventricular tachycardia, unspecified      Type 2 diabetes mellitus without complications      Gastro-esophageal reflux disease without esophagitis      Hyperlipidemia, unspecified      Dizziness and giddiness      Orthostatic hypotension      Other specified hypothyroidism      Paroxysmal atrial fibrillation      Hypokalemia        Care Team  Provider Specialty Phone Fax Service Dates   Mahala Menghini, MD Internal Medicine   Current      [facility]  This patient has registered at the [facility] Emergency Department  For more information visit: [email or web address]     PLEASE NOTE:     1.   Any care recommendations and other clinical information are provided as guidelines or for historical purposes only, and providers should exercise their own clinical judgment when providing care.    2.   You may only use this information  for purposes of treatment, payment or health care operations activities, and subject to the limitations of applicable [facility] Policies.    3.   You should consult directly with the organization that provided a care guideline or other clinical history with any questions about additional information or accuracy or completeness of information provided.    ? [id number] [facility] - [email or web address]

## 2022-10-16 NOTE — ED Triage Notes (Signed)
Pt BIBA as RTE, lives in [place]. Got up from chair after dinner and fell on back, striking head. Pt unsure if she takes blood thinners. C/o headache. Cardiac hx, pacemaker. A/Ox4, FC, clear/delayed speech. Bleeding from the back of the head, well controlled with gauze wrap.

## 2022-10-16 NOTE — ED Notes (Signed)
Bed: N 28  Expected date:   Expected time:   Means of arrival:   Comments:  Trauma 2 here

## 2022-10-16 NOTE — ED Notes (Signed)
Bed: T 2A  Expected date:   Expected time:   Means of arrival: [id number] - [place]  Comments:

## 2022-11-10 ENCOUNTER — Encounter (INDEPENDENT_AMBULATORY_CARE_PROVIDER_SITE_OTHER): Payer: Medicare Other

## 2022-12-06 DIAGNOSIS — I495 Sick sinus syndrome: Secondary | ICD-10-CM

## 2022-12-06 DIAGNOSIS — Z950 Presence of cardiac pacemaker: Secondary | ICD-10-CM

## 2022-12-06 LAB — REMOTE CARDIAC DEVICE MONITORING
AF Burden Percentage: 1
RV Pacing Percentage: 8.1

## 2022-12-17 NOTE — Progress Notes (Unsigned)
[facility] OFF[facility] PROGRESS NOTE    HRT [facility] OFFICE -CARDIOLOGY  [address]  [place] [address]  Dept: [phone]  Dept Fax: [phone]       Patient Name: Margaret Shea    Date of Visit:  December 19, 2022  Date of Birth: 08-13-1932  AGE: 89 y.o.  Medical Record #: [id number]  Requesting Physician: Talbert Nan, MD      CHIEF COMPLAINT: Follow-up for orthostatic hypotension and paroxysmal A-fib      HISTORY OF PRESENT ILLNESS:    She is a pleasant 89 y.o. female who presents today for follow-up for a history of paroxysmal atrial fibrillation and orthostatic hypotension.  Since I last saw her she has been moved to hospice.  She has generally done okay but she is no longer ambulatory.  They took her off of her anticoagulation because of a fall where she hit her head.  She has had multiple falls in the past.  She does continue to have some lightheadedness but no other cardiac symptoms.    We have seen her in the past for falls and compression fractures.  She had a wide-complex tachycardia last year that was thought to actually be atrial flutter with Edyth Gunnels C.  She does have a permanent pacemaker in place and sees her EP service.  Right now her biggest issue is her dementia and her general decline.    Today in the office her blood pressure is 124/62 with a heart rate of 70.      PAST MEDICAL HISTORY: She has a past medical history of Arrhythmia, Atrial fibrillation, Atrial tachycardia, Chest pain, Constipation, Gastroesophageal reflux disease, Hyperlipidemia, Hypothyroidism, Left arm pain, Pacemaker, PNA (pneumonia) (2016), Prolapse of uterus, SOB (shortness of breath), Tinnitus, Urinary tract infection (12/2015), Ventricular tachycardia, and Vertigo. She has a past surgical history that includes Back surgery (1980); Mohs surgery; Insert / replace / remove pacemaker (October 2011); Dental surgery; Cardiac catheterization (08/16/2015); Hysterectomy  (2004); Adenoidectomy; Tonsillectomy; COLPECTOMY (N/A, 02/07/2016); CYSTOSCOPY (N/A, 02/07/2016); ECHOCARDIOGRAM, TRANSTHORACIC (05/07/2019); and PET MPI Study (01/28/2019).    ALLERGIES:   Allergies   Allergen Reactions    Flecainide      Tinnitus,     Propafenone      dizziness       MEDICATIONS:   Current Outpatient Medications:     calcium carbonate (OS-CAL) 600 MG Tab tablet, Take 1 tablet (600 mg) by mouth 2 (two) times daily with meals, Disp: , Rfl:     Cholecalciferol (VITAMIN D PO), Take 1 tablet by mouth daily, Disp: , Rfl:     famotidine (PEPCID) 20 MG tablet, TAKE ONE TABLET BY MOUTH TWICE A DAY (Patient taking differently: 2 (two) times daily as needed), Disp: 180 tablet, Rfl: 3    fludrocortisone (FLORINEF) 0.1 MG tablet, Take 1 tablet (0.1 mg) by mouth daily, Disp: , Rfl:     levothyroxine (SYNTHROID, LEVOTHROID) 50 MCG tablet, Take 1 tablet (50 mcg) by mouth every morning, Disp: , Rfl:     loratadine (CLARITIN) 10 MG tablet, Take 1 tablet (10 mg) by mouth daily as needed, Disp: , Rfl:     senna-docusate (PERICOLACE) 8.6-50 MG per tablet, Take 2 tablets by mouth daily. (Patient taking differently: Take 1 tablet by mouth as needed for Constipation), Disp: 30 tablet, Rfl: 0     FAMILY HISTORY: family history includes Coronary artery disease in her father; Hyperlipidemia in her father and sister; Myocardial Infarction  in her father.    SOCIAL HISTORY: She reports that she has never smoked. She has never used smokeless tobacco. She reports that she does not currently use alcohol after a past usage of about 1.0 standard drink of alcohol per week. She reports that she does not use drugs.    PHYSICAL EXAMINATION    Visit Vitals  BP 124/62 (BP Site: Right arm, Patient Position: Sitting, Cuff Size: Medium)   Pulse 70   Ht 1.626 m (5\' 4" )   Wt 59 kg (130 lb)   LMP  (LMP Unknown)   BMI 22.31 kg/m       General Appearance:  A well-appearing female in no acute distress.    Skin: Warm and dry to touch  Head:  Normocephalic, normal hair pattern  Neck: JVP normal,   Chest: Rales at the right base  Cardiovascular: Regular rhythm, S1 normal, S2 normal, No S3 or S4, positive systolic murmurs. No gallops or rubs detected   Abdomen: Soft, nontender,  Extremities: Warm without edema. No clubbing, or cyanosis. All peripheral pulses are full and equal.   Neuro: Alert and oriented x3. No gross motor or sensory deficits noted, affect appropriate.      ECG: A paced    LABS:   No results found for: "CBC"  Lab Results   Component Value Date    AST 48 (H) 08/18/2022    ALT 59 (H) 08/18/2022     No results found for: "LIPID"  Lab Results   Component Value Date    HGBA1C 6.1 (H) 08/18/2022    TSH 3.77 06/16/2022      Most recent echo and nuclear study reviewed.    IMPRESSION:   Ms. Giammona is a 89 y.o. female with the following problems:    Here today for routine 27-month follow-up  Currently in hospice for progressive dementia  Wide-complex tachycardia at the hospital that was felt to likely be an atrial arrhythmia with V pacing  History of atrial arrhythmias  Orthostatic hypotension, on fludrocortisone  Multiple falls, now off anticoagulation  Status post [facility] permanent pacemaker  Recent fall with L2 compression fracture      RECOMMENDATIONS:    She will continue on her fludrocortisone.  She is not on any other cardiac medications.  She is now on hospice.  She can come back and see me in 6 months or earlier if any symptoms or problems arise.    Thank you for including me in her care.                                                     Orders Placed This Encounter   Procedures    Office Visit ([facility])       No orders of the defined types were placed in this encounter.      SIGNED:    Lynnea Maizes, MD          This note was generated by the Dragon speech recognition and may contain errors or omissions not intended by the user. Grammatical errors, random word insertions, deletions, pronoun errors, and incomplete sentences are  occasional consequences of this technology due to software limitations. Not all errors are caught or corrected. If there are questions or concerns about the content of this note or information contained within the body  of this dictation, they should be addressed directly with the author for clarification.

## 2022-12-18 ENCOUNTER — Other Ambulatory Visit (INDEPENDENT_AMBULATORY_CARE_PROVIDER_SITE_OTHER): Payer: Medicare Other | Admitting: Cardiovascular Disease

## 2022-12-19 ENCOUNTER — Encounter (INDEPENDENT_AMBULATORY_CARE_PROVIDER_SITE_OTHER): Payer: Self-pay | Admitting: Cardiovascular Disease

## 2022-12-19 ENCOUNTER — Ambulatory Visit (INDEPENDENT_AMBULATORY_CARE_PROVIDER_SITE_OTHER): Payer: Medicare Other | Admitting: Cardiovascular Disease

## 2022-12-19 VITALS — BP 124/62 | HR 70 | Ht 64.0 in | Wt 130.0 lb

## 2022-12-19 DIAGNOSIS — I495 Sick sinus syndrome: Secondary | ICD-10-CM

## 2022-12-19 DIAGNOSIS — E785 Hyperlipidemia, unspecified: Secondary | ICD-10-CM

## 2022-12-19 DIAGNOSIS — I480 Paroxysmal atrial fibrillation: Secondary | ICD-10-CM

## 2022-12-19 DIAGNOSIS — I951 Orthostatic hypotension: Secondary | ICD-10-CM

## 2022-12-19 DIAGNOSIS — Z950 Presence of cardiac pacemaker: Secondary | ICD-10-CM

## 2022-12-19 DIAGNOSIS — Z8673 Personal history of transient ischemic attack (TIA), and cerebral infarction without residual deficits: Secondary | ICD-10-CM

## 2023-03-06 ENCOUNTER — Other Ambulatory Visit (INDEPENDENT_AMBULATORY_CARE_PROVIDER_SITE_OTHER): Payer: Medicare Other | Admitting: Cardiovascular Disease

## 2023-03-07 DIAGNOSIS — I495 Sick sinus syndrome: Secondary | ICD-10-CM

## 2023-03-07 DIAGNOSIS — Z950 Presence of cardiac pacemaker: Secondary | ICD-10-CM

## 2023-03-07 LAB — REMOTE CARDIAC DEVICE MONITORING
AF Burden Percentage: 1
RV Pacing Percentage: 7.8

## 2023-04-10 ENCOUNTER — Other Ambulatory Visit (INDEPENDENT_AMBULATORY_CARE_PROVIDER_SITE_OTHER): Payer: Medicare Other | Admitting: Cardiovascular Disease

## 2023-06-06 DIAGNOSIS — Z950 Presence of cardiac pacemaker: Secondary | ICD-10-CM

## 2023-06-06 DIAGNOSIS — I495 Sick sinus syndrome: Secondary | ICD-10-CM

## 2023-06-06 LAB — REMOTE CARDIAC DEVICE MONITORING
AF Burden Percentage: 1
RV Pacing Percentage: 9.3

## 2023-06-24 NOTE — Progress Notes (Signed)
[facility] PROGRESS NOTE    HRT [facility] OFFICE -CARDIOLOGY  [address]  [place] [address]  Dept: [phone]  Dept Fax: [phone]       Patient Name: Margaret Shea    Date of Visit:  June 24, 2023  Date of Birth: 01-21-1932  AGE: 89 y.o.  Medical Record #: [id number]  Requesting Physician: Talbert Nan, MD      CHIEF COMPLAINT: Follow-up for orthostatic hypotension and paroxysmal A-fib      HISTORY OF PRESENT ILLNESS:    She is a pleasant 89 y.o. female who presents today for follow-up for a history of paroxysmal atrial fibrillation and orthostatic hypotension.  Since I last saw her she has been moved to hospice.  She has generally done okay but she is no longer ambulatory.  They took her off of her anticoagulation because of a fall where she hit her head.  She has had multiple falls in the past.  She does continue to have some lightheadedness but no other cardiac symptoms.    We have seen her in the past for falls and compression fractures.  She had a wide-complex tachycardia last year that was thought to actually be atrial flutter with Margaret Shea.  She does have a permanent pacemaker in place and sees her EP service.  Right now her biggest issue is her dementia and her general decline.    Today in the office her blood pressure is 124/62 with a heart rate of 70.      PAST MEDICAL HISTORY: She has a past medical history of Arrhythmia, Atrial fibrillation, Atrial tachycardia, Chest pain, Constipation, Gastroesophageal reflux disease, Hyperlipidemia, Hypothyroidism, Left arm pain, Pacemaker, PNA (pneumonia) (2016), Prolapse of uterus, SOB (shortness of breath), Tinnitus, Urinary tract infection (12/2015), Ventricular tachycardia, and Vertigo. She has a past surgical history that includes Back surgery (1980); Mohs surgery; Insert / replace / remove pacemaker (October 2011); Dental surgery; Cardiac catheterization (08/16/2015);  Hysterectomy (2004); Adenoidectomy; Tonsillectomy; COLPECTOMY (N/A, 02/07/2016); CYSTOSCOPY, DIAGNOSTIC (N/A, 02/07/2016); ECHOCARDIOGRAM, TRANSTHORACIC (05/07/2019); and PET MPI Study (01/28/2019).    ALLERGIES:   Allergies   Allergen Reactions    Flecainide      Tinnitus,     Propafenone      dizziness       MEDICATIONS:   Current Outpatient Medications:     calcium carbonate (OS-CAL) 600 MG Tab tablet, Take 1 tablet (600 mg) by mouth 2 (two) times daily with meals, Disp: , Rfl:     Cholecalciferol (VITAMIN D PO), Take 1 tablet by mouth daily, Disp: , Rfl:     famotidine (PEPCID) 20 MG tablet, TAKE ONE TABLET BY MOUTH TWICE A DAY (Patient taking differently: 2 (two) times daily as needed), Disp: 180 tablet, Rfl: 3    fludrocortisone (FLORINEF) 0.1 MG tablet, Take 1 tablet (0.1 mg) by mouth daily, Disp: , Rfl:     levothyroxine (SYNTHROID, LEVOTHROID) 50 MCG tablet, Take 1 tablet (50 mcg) by mouth every morning, Disp: , Rfl:     loratadine (CLARITIN) 10 MG tablet, Take 1 tablet (10 mg) by mouth daily as needed, Disp: , Rfl:     senna-docusate (PERICOLACE) 8.6-50 MG per tablet, Take 2 tablets by mouth daily. (Patient taking differently: Take 1 tablet by mouth as needed for Constipation), Disp: 30 tablet, Rfl: 0     FAMILY HISTORY: family history includes Coronary artery disease in her father; Hyperlipidemia in her father and sister; Myocardial  Infarction in her father.    SOCIAL HISTORY: She reports that she has never smoked. She has never used smokeless tobacco. She reports that she does not currently use alcohol after a past usage of about 1.0 standard drink of alcohol per week. She reports that she does not use drugs.    PHYSICAL EXAMINATION    Visit Vitals  LMP  (LMP Unknown)       General Appearance:  A well-appearing female in no acute distress.    Skin: Warm and dry to touch  Head: Normocephalic, normal hair pattern  Neck: JVP normal,   Chest: Rales at the right base  Cardiovascular: Regular rhythm, S1 normal,  S2 normal, No S3 or S4, positive systolic murmurs. No gallops or rubs detected   Abdomen: Soft, nontender,  Extremities: Warm without edema. No clubbing, or cyanosis. All peripheral pulses are full and equal.   Neuro: Alert and oriented x3. No gross motor or sensory deficits noted, affect appropriate.      ECG: A paced    LABS:   No results found for: "CBC"  Lab Results   Component Value Date    AST 48 (H) 08/18/2022    ALT 59 (H) 08/18/2022     No results found for: "LIPID"  Lab Results   Component Value Date    HGBA1C 6.1 (H) 08/18/2022    TSH 3.77 06/16/2022      Most recent echo and nuclear study reviewed.    IMPRESSION:   Margaret Shea is a 89 y.o. female with the following problems:    Here today for routine 63-month follow-up  Currently in hospice for progressive dementia  Wide-complex tachycardia at the hospital that was felt to likely be an atrial arrhythmia with V pacing  History of atrial arrhythmias  Orthostatic hypotension, on fludrocortisone  Multiple falls, now off anticoagulation  Status post [facility] permanent pacemaker  Recent fall with L2 compression fracture      RECOMMENDATIONS:    She will continue on her fludrocortisone.  She is not on any other cardiac medications.  She is now on hospice.  She can come back and see me in 6 months or earlier if any symptoms or problems arise.    Thank you for including me in her care.                                                     No orders of the defined types were placed in this encounter.      No orders of the defined types were placed in this encounter.      SIGNED:    Lynnea Maizes, MD          This note was generated by the Dragon speech recognition and may contain errors or omissions not intended by the user. Grammatical errors, random word insertions, deletions, pronoun errors, and incomplete sentences are occasional consequences of this technology due to software limitations. Not all errors are caught or corrected. If there are questions or  concerns about the content of this note or information contained within the body of this dictation, they should be addressed directly with the author for clarification.

## 2023-06-26 ENCOUNTER — Encounter (INDEPENDENT_AMBULATORY_CARE_PROVIDER_SITE_OTHER): Payer: Medicare Other | Admitting: Cardiovascular Disease

## 2023-06-29 DEATH — deceased

## 2023-09-14 ENCOUNTER — Encounter (INDEPENDENT_AMBULATORY_CARE_PROVIDER_SITE_OTHER): Payer: Medicare Other | Admitting: Nurse Practitioner
